# Patient Record
Sex: Female | Born: 1995 | Hispanic: No | Marital: Single | Smoking: Never smoker
Health system: Southern US, Community
[De-identification: ages and names within clinical notes are randomized; demographics above are authoritative.]

## PROBLEM LIST (undated history)

## (undated) ENCOUNTER — Inpatient Hospital Stay (HOSPITAL_COMMUNITY): Payer: Self-pay

## (undated) DIAGNOSIS — Z789 Other specified health status: Secondary | ICD-10-CM

## (undated) HISTORY — PX: NO PAST SURGERIES: SHX2092

---
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
## 2015-04-01 NOTE — L&D Delivery Note (Signed)
Delivery Note At 1:37 PM a viable female was delivered via Vaginal, Spontaneous Delivery (Compound Arm Presentation:OA; ROA ).   APGAR:  8,9 ; weight pending  .   Placenta status: intact, delivered via Tomasa BlaseSchultz .  Cord: intact with the following complications: tight nuchal x1, somersaulted through.  Cord pH: N/A  Anesthesia:  Epidural Episiotomy:  None Lacerations:  First degree right vulvar, Bilateral labial hemostatic Suture Repair: 3.0 vicryl rapide Est. Blood Loss (mL):  200  Mom to postpartum.  Baby to Couplet care / Skin to Skin.  Clayton BiblesSamantha Weinhold, SNM 02/11/2016, 2:02 PM  Midwife attestation: I was gloved and present for delivery in its entirety and I agree with the above student's note.  Donette LarryMelanie Lathan Gieselman, CNM 2:14 PM

## 2015-09-12 ENCOUNTER — Encounter: Payer: Self-pay | Admitting: Certified Nurse Midwife

## 2015-09-12 ENCOUNTER — Ambulatory Visit (INDEPENDENT_AMBULATORY_CARE_PROVIDER_SITE_OTHER): Payer: Medicaid Other | Admitting: Certified Nurse Midwife

## 2015-09-12 VITALS — BP 113/71 | HR 95 | Temp 98.2°F | Wt 191.0 lb

## 2015-09-12 DIAGNOSIS — Z3492 Encounter for supervision of normal pregnancy, unspecified, second trimester: Secondary | ICD-10-CM

## 2015-09-12 DIAGNOSIS — O0932 Supervision of pregnancy with insufficient antenatal care, second trimester: Secondary | ICD-10-CM

## 2015-09-12 DIAGNOSIS — O0930 Supervision of pregnancy with insufficient antenatal care, unspecified trimester: Secondary | ICD-10-CM | POA: Insufficient documentation

## 2015-09-12 DIAGNOSIS — Z3490 Encounter for supervision of normal pregnancy, unspecified, unspecified trimester: Secondary | ICD-10-CM | POA: Insufficient documentation

## 2015-09-12 DIAGNOSIS — B373 Candidiasis of vulva and vagina: Secondary | ICD-10-CM

## 2015-09-12 DIAGNOSIS — B3731 Acute candidiasis of vulva and vagina: Secondary | ICD-10-CM

## 2015-09-12 DIAGNOSIS — Z3482 Encounter for supervision of other normal pregnancy, second trimester: Secondary | ICD-10-CM

## 2015-09-12 LAB — POCT URINALYSIS DIPSTICK
Bilirubin, UA: NEGATIVE
Blood, UA: NEGATIVE
Glucose, UA: 50
NITRITE UA: NEGATIVE
PH UA: 5
Spec Grav, UA: 1.02
Urobilinogen, UA: NEGATIVE

## 2015-09-12 MED ORDER — TERCONAZOLE 0.8 % VA CREA: 1.0000 | TOPICAL_CREAM | Freq: Every day | VAGINAL | Status: DC

## 2015-09-12 MED ORDER — VITAFOL GUMMIES 3.33-0.333-34.8 MG PO CHEW: 3.0000 | CHEWABLE_TABLET | Freq: Every day | ORAL | Status: DC

## 2015-09-12 MED ORDER — FLUCONAZOLE 100 MG PO TABS: 100.0000 mg | ORAL_TABLET | Freq: Once | ORAL | Status: DC

## 2015-09-12 NOTE — Progress Notes (Signed)
Patient has thick, clumpy discharge. Patient has a history of cyst- possible hydradenitis. Patient is having ligament pain.

## 2015-09-12 NOTE — Progress Notes (Signed)
Subjective:    Angel Huynh is being seen today for her first obstetrical visit.  This not prevented, not planned a planned pregnancy. She is at [redacted]w[redacted]d gestation. Her obstetrical history is significant for previous smoker; stopped January 1st. Relationship with FOB: significant other, living together. Patient does intend to breast feed. Pregnancy history fully reviewed.  The information documented in the HPI was reviewed and verified.  Menstrual History: OB History    Gravida Para Term Preterm AB TAB SAB Ectopic Multiple Living   21 mo old, term, 6#9oz  Menarche age: 20 years old  Patient's last menstrual period was 04/27/2015 (approximate).    No past medical history on file.  No past surgical history on file.   (Not in a hospital admission) No Known Allergies  Social History  Substance Use Topics  . Smoking status: Not on file  . Smokeless tobacco: Not on file  . Alcohol Use: Not on file    No family history on file.   Review of Systems Constitutional: negative for weight loss Gastrointestinal: negative for vomiting Genitourinary:negative for genital lesions and vaginal discharge and dysuria Musculoskeletal:negative for back pain Behavioral/Psych: negative for abusive relationship, depression, illegal drug usage and tobacco use    Objective:    BP 113/71 mmHg  Pulse 95  Temp(Src) 98.2 F (36.8 C)  Wt 191 lb (86.637 kg)  LMP 04/27/2015 (Approximate) General Appearance:    Alert, cooperative, no distress, appears stated age  Head:    Normocephalic, without obvious abnormality, atraumatic  Eyes:    PERRL, conjunctiva/corneas clear, EOM's intact, fundi    benign, both eyes  Ears:    Normal TM's and external ear canals, both ears  Nose:   Nares normal, septum midline, mucosa normal, no drainage    or sinus tenderness  Throat:   Lips, mucosa, and tongue normal; teeth and gums normal  Neck:   Supple, symmetrical, trachea midline, no adenopathy;   thyroid:  no enlargement/tenderness/nodules; no carotid   bruit or JVD  Back:     Symmetric, no curvature, ROM normal, no CVA tenderness  Lungs:     Clear to auscultation bilaterally, respirations unlabored  Chest Wall:    No tenderness or deformity   Heart:    Regular rate and rhythm, S1 and S2 normal, no murmur, rub   or gallop  Breast Exam:    No tenderness, masses, or nipple abnormality  Abdomen:     Soft, non-tender, bowel sounds active all four quadrants,    no masses, no organomegaly  Genitalia:    Normal female without lesion, discharge or tenderness  Extremities:   Extremities normal, atraumatic, no cyanosis or edema  Pulses:   2+ and symmetric all extremities  Skin:   Skin color, texture, turgor normal, no rashes or lesions  Lymph nodes:   Cervical, supraclavicular, and axillary nodes normal  Neurologic:   CNII-XII intact, normal strength, sensation and reflexes    throughout      Lab Review Urine pregnancy test Labs reviewed no Radiologic studies reviewed no Assessment:    Pregnancy at [redacted]w[redacted]d weeks   Late to prenatal care  Plan:      Prenatal vitamins.  Counseling provided regarding continued use of seat belts, cessation of alcohol consumption, smoking or use of illicit drugs; infection precautions i.e., influenza/TDAP immunizations, toxoplasmosis,CMV, parvovirus, listeria and varicella; workplace safety, exercise during pregnancy; routine dental care, safe medications, sexual activity, hot  tubs, saunas, pools, travel, caffeine use, fish and methlymercury, potential toxins, hair treatments, varicose veins Weight gain recommendations per IOM guidelines reviewed: underweight/BMI< 18.5--> gain 28 - 40 lbs; normal weight/BMI 18.5 - 24.9--> gain 25 - 35 lbs; overweight/BMI 25 - 29.9--> gain 15 - 25 lbs; obese/BMI >30->gain  11 - 20 lbs Problem list reviewed and updated. FIRST/CF mutation testing/NIPT/QUAD SCREEN/fragile X/Ashkenazi Jewish population testing/Spinal muscular  atrophy discussed: ordered. Role of ultrasound in pregnancy discussed; fetal survey: ordered. Amniocentesis discussed: not indicated. VBAC calculator score: VBAC consent form provided Meds ordered this encounter  Medications  . Prenatal Vit-Fe Fumarate-FA (PRENATAL MULTIVITAMIN) TABS tablet    Sig: Take 1 tablet by mouth daily at 12 noon.   Orders Placed This Encounter  Procedures  . Culture, OB Urine  . HIV antibody  . Hemoglobinopathy evaluation  . Varicella zoster antibody, IgG  . VITAMIN D 25 Hydroxy (Vit-D Deficiency, Fractures)  . Prenatal Profile I  . POCT urinalysis dipstick    Follow up in 4 weeks. 50% of 30 min visit spent on counseling and coordination of care.

## 2015-09-13 ENCOUNTER — Other Ambulatory Visit: Payer: Self-pay | Admitting: Certified Nurse Midwife

## 2015-09-14 LAB — URINE CULTURE, OB REFLEX

## 2015-09-14 LAB — CULTURE, OB URINE

## 2015-09-17 LAB — NUSWAB VG+, CANDIDA 6SP
ATOPOBIUM VAGINAE: HIGH {score} — AB
CANDIDA LUSITANIAE, NAA: NEGATIVE
CANDIDA PARAPSILOSIS, NAA: NEGATIVE
CHLAMYDIA TRACHOMATIS, NAA: NEGATIVE
Candida albicans, NAA: POSITIVE — AB
Candida glabrata, NAA: NEGATIVE
Candida krusei, NAA: NEGATIVE
Candida tropicalis, NAA: NEGATIVE
NEISSERIA GONORRHOEAE, NAA: NEGATIVE
Trich vag by NAA: NEGATIVE

## 2015-09-18 ENCOUNTER — Ambulatory Visit (INDEPENDENT_AMBULATORY_CARE_PROVIDER_SITE_OTHER): Payer: Medicaid Other

## 2015-09-18 DIAGNOSIS — Z3482 Encounter for supervision of other normal pregnancy, second trimester: Secondary | ICD-10-CM

## 2015-09-18 DIAGNOSIS — Z3492 Encounter for supervision of normal pregnancy, unspecified, second trimester: Secondary | ICD-10-CM

## 2015-09-18 DIAGNOSIS — Z36 Encounter for antenatal screening of mother: Secondary | ICD-10-CM

## 2015-09-18 LAB — PRENATAL PROFILE I(LABCORP)
ANTIBODY SCREEN: NEGATIVE
BASOS: 0 %
Basophils Absolute: 0 10*3/uL (ref 0.0–0.2)
EOS (ABSOLUTE): 0.1 10*3/uL (ref 0.0–0.4)
Eos: 2 %
HEMATOCRIT: 35.6 % (ref 34.0–46.6)
HEMOGLOBIN: 12 g/dL (ref 11.1–15.9)
Hepatitis B Surface Ag: NEGATIVE
Immature Grans (Abs): 0 10*3/uL (ref 0.0–0.1)
Immature Granulocytes: 0 %
LYMPHS ABS: 2.5 10*3/uL (ref 0.7–3.1)
Lymphs: 28 %
MCH: 27.3 pg (ref 26.6–33.0)
MCHC: 33.7 g/dL (ref 31.5–35.7)
MCV: 81 fL (ref 79–97)
MONOS ABS: 0.7 10*3/uL (ref 0.1–0.9)
Monocytes: 7 %
Neutrophils Absolute: 5.6 10*3/uL (ref 1.4–7.0)
Neutrophils: 63 %
PLATELETS: 264 10*3/uL (ref 150–379)
RBC: 4.4 x10E6/uL (ref 3.77–5.28)
RDW: 14.9 % (ref 12.3–15.4)
RPR: NONREACTIVE
RUBELLA: 1.47 {index} (ref >0.99)
Rh Factor: POSITIVE
WBC: 8.9 10*3/uL (ref 3.4–10.8)

## 2015-09-18 LAB — MATERNIT21 PLUS CORE+SCA
Chromosome 13: NEGATIVE
Chromosome 18: NEGATIVE
Chromosome 21: NEGATIVE
PDF: 0
Y Chromosome: DETECTED

## 2015-09-18 LAB — HEMOGLOBINOPATHY EVALUATION
HEMOGLOBIN F QUANTITATION: 0 % (ref 0.0–2.0)
HGB C: 0 %
HGB S: 0 %
Hemoglobin A2 Quantitation: 2.4 % (ref 0.7–3.1)
Hgb A: 97.6 % (ref 94.0–98.0)

## 2015-09-18 LAB — VARICELLA ZOSTER ANTIBODY, IGG

## 2015-09-18 LAB — VITAMIN D 25 HYDROXY (VIT D DEFICIENCY, FRACTURES): VIT D 25 HYDROXY: 12.8 ng/mL — AB (ref 30.0–100.0)

## 2015-09-18 LAB — HIV ANTIBODY (ROUTINE TESTING W REFLEX): HIV SCREEN 4TH GENERATION: NONREACTIVE

## 2015-09-19 ENCOUNTER — Other Ambulatory Visit: Payer: Self-pay | Admitting: Certified Nurse Midwife

## 2015-09-20 ENCOUNTER — Encounter: Payer: Self-pay | Admitting: *Deleted

## 2015-10-10 ENCOUNTER — Ambulatory Visit (INDEPENDENT_AMBULATORY_CARE_PROVIDER_SITE_OTHER): Payer: Medicaid Other | Admitting: Certified Nurse Midwife

## 2015-10-10 VITALS — BP 105/70 | HR 86 | Wt 195.0 lb

## 2015-10-10 DIAGNOSIS — Z3482 Encounter for supervision of other normal pregnancy, second trimester: Secondary | ICD-10-CM

## 2015-10-10 LAB — POCT URINALYSIS DIPSTICK
BILIRUBIN UA: NEGATIVE
Blood, UA: NEGATIVE
Glucose, UA: NEGATIVE
KETONES UA: NEGATIVE
LEUKOCYTES UA: NEGATIVE
Nitrite, UA: NEGATIVE
PROTEIN UA: NEGATIVE
Spec Grav, UA: 1.02
Urobilinogen, UA: NEGATIVE
pH, UA: 5

## 2015-10-10 NOTE — Progress Notes (Signed)
Patient reports she is doing well 

## 2015-10-10 NOTE — Progress Notes (Signed)
Subjective:    Angel NortonJazmin Huynh is a 20 y.o. female being seen today for her obstetrical visit. She is at 774w5d gestation. Patient reports: no complaints . Fetal movement: normal.  Problem List Items Addressed This Visit      Other   Encounter for supervision of other normal pregnancy in second trimester - Primary   Relevant Orders   POCT urinalysis dipstick (Completed)     Patient Active Problem List   Diagnosis Date Noted  . Encounter for supervision of other normal pregnancy in second trimester 09/12/2015  . Late prenatal care affecting pregnancy in second trimester, antepartum 09/12/2015   Objective:    BP 105/70 mmHg  Pulse 86  Wt 195 lb (88.451 kg)  LMP 04/27/2015 (Approximate) FHT: 150 BPM  Uterine Size: 25 cm and size greater than dates     Assessment:    Pregnancy @ 124w5d    Doing well  Plan:    OBGCT: discussed and ordered for next visit. Signs and symptoms of preterm labor: discussed.  Labs, problem list reviewed and updated 2 hr GTT planned Follow up in 4 weeks.

## 2015-11-07 ENCOUNTER — Ambulatory Visit (INDEPENDENT_AMBULATORY_CARE_PROVIDER_SITE_OTHER): Payer: Medicaid Other | Admitting: Certified Nurse Midwife

## 2015-11-07 ENCOUNTER — Other Ambulatory Visit: Payer: Medicaid Other

## 2015-11-07 VITALS — BP 123/82 | HR 122 | Temp 98.3°F | Wt 196.2 lb

## 2015-11-07 DIAGNOSIS — Z3482 Encounter for supervision of other normal pregnancy, second trimester: Secondary | ICD-10-CM

## 2015-11-07 DIAGNOSIS — Z3492 Encounter for supervision of normal pregnancy, unspecified, second trimester: Secondary | ICD-10-CM

## 2015-11-07 LAB — POCT URINALYSIS DIPSTICK
BILIRUBIN UA: NEGATIVE
GLUCOSE UA: NEGATIVE
LEUKOCYTES UA: NEGATIVE
NITRITE UA: NEGATIVE
Protein, UA: NEGATIVE
RBC UA: NEGATIVE
Spec Grav, UA: 1.015
UROBILINOGEN UA: 0.2
pH, UA: 6

## 2015-11-07 NOTE — Progress Notes (Signed)
Pt c/o upper abdominal pressure

## 2015-11-07 NOTE — Addendum Note (Signed)
Addended by: Marya LandryFOSTER, Xylah Early D on: 11/07/2015 10:24 AM   Modules accepted: Orders

## 2015-11-07 NOTE — Progress Notes (Signed)
Subjective:    Angel Huynh is a 20 y.o. female being seen today for her obstetrical visit. She is at 7035w5d gestation. Patient reports: backache, heartburn, no bleeding, no contractions and no leaking.  Has a hx of reflux, does not want pills will try OTC Tums.  Fetal movement: normal.  Problem List Items Addressed This Visit    None    Visit Diagnoses    Prenatal care, second trimester    -  Primary   Relevant Orders   POCT Urinalysis Dipstick (Completed)     Patient Active Problem List   Diagnosis Date Noted  . Encounter for supervision of other normal pregnancy in second trimester 09/12/2015  . Late prenatal care affecting pregnancy in second trimester, antepartum 09/12/2015   Objective:    BP 123/82   Pulse (!) 122   Temp 98.3 F (36.8 C)   Wt 196 lb 3.2 oz (89 kg)   LMP 04/27/2015 (Approximate)  FHT: 135 BPM  Uterine Size: 28 cm and size equals dates     Assessment:    Pregnancy @ 4935w5d    2 hr OGTT today  Reflux in pregnancy  Plan:    OBGCT: ordered. Signs and symptoms of preterm labor: discussed.  Labs, problem list reviewed and updated 2 hr GTT planned Follow up in 2 weeks.

## 2015-11-08 LAB — CBC
HEMOGLOBIN: 11.2 g/dL (ref 11.1–15.9)
Hematocrit: 33.6 % — ABNORMAL LOW (ref 34.0–46.6)
MCH: 27.6 pg (ref 26.6–33.0)
MCHC: 33.3 g/dL (ref 31.5–35.7)
MCV: 83 fL (ref 79–97)
PLATELETS: 240 10*3/uL (ref 150–379)
RBC: 4.06 x10E6/uL (ref 3.77–5.28)
RDW: 13.8 % (ref 12.3–15.4)
WBC: 12.2 10*3/uL — AB (ref 3.4–10.8)

## 2015-11-08 LAB — GLUCOSE TOLERANCE, 2 HOURS W/ 1HR
GLUCOSE, 1 HOUR: 115 mg/dL (ref 65–179)
GLUCOSE, FASTING: 70 mg/dL (ref 65–91)
Glucose, 2 hour: 96 mg/dL (ref 65–152)

## 2015-11-08 LAB — HIV ANTIBODY (ROUTINE TESTING W REFLEX): HIV SCREEN 4TH GENERATION: NONREACTIVE

## 2015-11-08 LAB — RPR: RPR: NONREACTIVE

## 2015-11-21 ENCOUNTER — Ambulatory Visit (INDEPENDENT_AMBULATORY_CARE_PROVIDER_SITE_OTHER): Payer: Medicaid Other | Admitting: Certified Nurse Midwife

## 2015-11-21 VITALS — BP 109/69 | HR 100 | Temp 98.9°F | Wt 198.6 lb

## 2015-11-21 DIAGNOSIS — Z3492 Encounter for supervision of normal pregnancy, unspecified, second trimester: Secondary | ICD-10-CM

## 2015-11-21 DIAGNOSIS — Z23 Encounter for immunization: Secondary | ICD-10-CM

## 2015-11-21 DIAGNOSIS — Z3482 Encounter for supervision of other normal pregnancy, second trimester: Secondary | ICD-10-CM

## 2015-11-21 LAB — POCT URINALYSIS DIPSTICK
BILIRUBIN UA: NEGATIVE
GLUCOSE UA: NEGATIVE
Ketones, UA: NEGATIVE
LEUKOCYTES UA: NEGATIVE
NITRITE UA: NEGATIVE
Protein, UA: NEGATIVE
RBC UA: NEGATIVE
Spec Grav, UA: 1.015
UROBILINOGEN UA: 0.2
pH, UA: 7

## 2015-11-21 NOTE — Progress Notes (Signed)
Patient has not started using TUMS yet- she is still having upper pressure.

## 2015-11-21 NOTE — Progress Notes (Signed)
Subjective:    Merita NortonJazmin Stice is a 20 y.o. female being seen today for her obstetrical visit. She is at 5245w5d gestation. Patient reports no complaints. Fetal movement: normal.  Problem List Items Addressed This Visit      Other   Encounter for supervision of other normal pregnancy in second trimester    Other Visit Diagnoses    Prenatal care, second trimester    -  Primary   Relevant Orders   POCT Urinalysis Dipstick (Completed)   Encounter for immunization       Relevant Orders   Flu Vaccine QUAD 36+ mos IM (Completed)     Patient Active Problem List   Diagnosis Date Noted  . Encounter for supervision of other normal pregnancy in second trimester 09/12/2015  . Late prenatal care affecting pregnancy in second trimester, antepartum 09/12/2015   Objective:    BP 109/69   Pulse 100   Temp 98.9 F (37.2 C)   Wt 198 lb 9.6 oz (90.1 kg)   LMP 04/27/2015 (Approximate)  FHT:  152 BPM  Uterine Size: 30 cm and size equals dates  Presentation: cephalic     Assessment:    Pregnancy @ 6145w5d weeks   H/O GERD  Plan:    Influenza today   labs reviewed, problem list updated Consent signed. GBS planning TDAP offered, declined  Rhogam given for RH negative Pediatrician: discussed. Infant feeding: plans to breastfeed. Maternity leave: N/A. Cigarette smoking: never smoked. Orders Placed This Encounter  Procedures  . Flu Vaccine QUAD 36+ mos IM  . POCT Urinalysis Dipstick   No orders of the defined types were placed in this encounter.  Follow up in 2 Weeks.

## 2015-12-07 ENCOUNTER — Ambulatory Visit (INDEPENDENT_AMBULATORY_CARE_PROVIDER_SITE_OTHER): Payer: Medicaid Other | Admitting: Advanced Practice Midwife

## 2015-12-07 VITALS — BP 98/69 | HR 112 | Temp 98.5°F | Wt 194.6 lb

## 2015-12-07 DIAGNOSIS — Z3482 Encounter for supervision of other normal pregnancy, second trimester: Secondary | ICD-10-CM

## 2015-12-07 DIAGNOSIS — Z23 Encounter for immunization: Secondary | ICD-10-CM | POA: Diagnosis not present

## 2015-12-07 DIAGNOSIS — O0932 Supervision of pregnancy with insufficient antenatal care, second trimester: Secondary | ICD-10-CM

## 2015-12-07 NOTE — Patient Instructions (Signed)
Tdap Vaccine (Tetanus, Diphtheria and Pertussis): What You Need to Know 1. Why get vaccinated? Tetanus, diphtheria and pertussis are very serious diseases. Tdap vaccine can protect us from these diseases. And, Tdap vaccine given to pregnant women can protect newborn babies against pertussis. TETANUS (Lockjaw) is rare in the [place] today. It causes painful muscle tightening and stiffness, usually all over the body.  It can lead to tightening of muscles in the head and neck so you can't open your mouth, swallow, or sometimes even breathe. Tetanus kills about 1 out of 10 people who are infected even after receiving the best medical care. DIPHTHERIA is also rare in the [place] today. It can cause a thick coating to form in the back of the throat.  It can lead to breathing problems, heart failure, paralysis, and death. PERTUSSIS (Whooping Cough) causes severe coughing spells, which can cause difficulty breathing, vomiting and disturbed sleep.  It can also lead to weight loss, incontinence, and rib fractures. Up to 2 in 100 adolescents and 5 in 100 adults with pertussis are hospitalized or have complications, which could include pneumonia or death. These diseases are caused by bacteria. Diphtheria and pertussis are spread from person to person through secretions from coughing or sneezing. Tetanus enters the body through cuts, scratches, or wounds. Before vaccines, as many as 200,000 cases of diphtheria, 200,000 cases of pertussis, and hundreds of cases of tetanus, were reported in the [place] each year. Since vaccination began, reports of cases for tetanus and diphtheria have dropped by about 99% and for pertussis by about 80%. 2. Tdap vaccine Tdap vaccine can protect adolescents and adults from tetanus, diphtheria, and pertussis. One dose of Tdap is routinely given at age 11 or 12. People who did not get Tdap at that age should get it as soon as possible. Tdap is especially important  for healthcare professionals and anyone having close contact with a baby younger than 12 months. Pregnant women should get a dose of Tdap during every pregnancy, to protect the newborn from pertussis. Infants are most at risk for severe, life-threatening complications from pertussis. Another vaccine, called Td, protects against tetanus and diphtheria, but not pertussis. A Td booster should be given every 10 years. Tdap may be given as one of these boosters if you have never gotten Tdap before. Tdap may also be given after a severe cut or burn to prevent tetanus infection. Your doctor or the person giving you the vaccine can give you more information. Tdap may safely be given at the same time as other vaccines. 3. Some people should not get this vaccine  A person who has ever had a life-threatening allergic reaction after a previous dose of any diphtheria, tetanus or pertussis containing vaccine, OR has a severe allergy to any part of this vaccine, should not get Tdap vaccine. Tell the person giving the vaccine about any severe allergies.  Anyone who had coma or long repeated seizures within 7 days after a childhood dose of DTP or DTaP, or a previous dose of Tdap, should not get Tdap, unless a cause other than the vaccine was found. They can still get Td.  Talk to your doctor if you:  have seizures or another nervous system problem,  had severe pain or swelling after any vaccine containing diphtheria, tetanus or pertussis,  ever had a condition called Guillain-Barr Syndrome (GBS),  aren't feeling well on the day the shot is scheduled. 4. Risks With any medicine, including vaccines, there is   a chance of side effects. These are usually mild and go away on their own. Serious reactions are also possible but are rare. Most people who get Tdap vaccine do not have any problems with it. Mild problems following Tdap (Did not interfere with activities)  Pain where the shot was given (about 3 in 4  adolescents or 2 in 3 adults)  Redness or swelling where the shot was given (about 1 person in 5)  Mild fever of at least 100.4F (up to about 1 in 25 adolescents or 1 in 100 adults)  Headache (about 3 or 4 people in 10)  Tiredness (about 1 person in 3 or 4)  Nausea, vomiting, diarrhea, stomach ache (up to 1 in 4 adolescents or 1 in 10 adults)  Chills, sore joints (about 1 person in 10)  Body aches (about 1 person in 3 or 4)  Rash, swollen glands (uncommon) Moderate problems following Tdap (Interfered with activities, but did not require medical attention)  Pain where the shot was given (up to 1 in 5 or 6)  Redness or swelling where the shot was given (up to about 1 in 16 adolescents or 1 in 12 adults)  Fever over 102F (about 1 in 100 adolescents or 1 in 250 adults)  Headache (about 1 in 7 adolescents or 1 in 10 adults)  Nausea, vomiting, diarrhea, stomach ache (up to 1 or 3 people in 100)  Swelling of the entire arm where the shot was given (up to about 1 in 500). Severe problems following Tdap (Unable to perform usual activities; required medical attention)  Swelling, severe pain, bleeding and redness in the arm where the shot was given (rare). Problems that could happen after any vaccine:  People sometimes faint after a medical procedure, including vaccination. Sitting or lying down for about 15 minutes can help prevent fainting, and injuries caused by a fall. Tell your doctor if you feel dizzy, or have vision changes or ringing in the ears.  Some people get severe pain in the shoulder and have difficulty moving the arm where a shot was given. This happens very rarely.  Any medication can cause a severe allergic reaction. Such reactions from a vaccine are very rare, estimated at fewer than 1 in a million doses, and would happen within a few minutes to a few hours after the vaccination. As with any medicine, there is a very remote chance of a vaccine causing a serious  injury or death. The safety of vaccines is always being monitored. For more information, visit: www.cdc.gov/vaccinesafety/ 5. What if there is a serious problem? What should I look for?  Look for anything that concerns you, such as signs of a severe allergic reaction, very high fever, or unusual behavior.  Signs of a severe allergic reaction can include hives, swelling of the face and throat, difficulty breathing, a fast heartbeat, dizziness, and weakness. These would usually start a few minutes to a few hours after the vaccination. What should I do?  If you think it is a severe allergic reaction or other emergency that can't wait, call 9-1-1 or get the person to the nearest hospital. Otherwise, call your doctor.  Afterward, the reaction should be reported to the Vaccine Adverse Event Reporting System (VAERS). Your doctor might file this report, or you can do it yourself through the VAERS web site at www.vaers.hhs.gov, or by calling 1-800-822-7967. VAERS does not give medical advice.  6. The National Vaccine Injury Compensation Program The National Vaccine Injury Compensation Program (  VICP) is a federal program that was created to compensate people who may have been injured by certain vaccines. Persons who believe they may have been injured by a vaccine can learn about the program and about filing a claim by calling 1-800-338-2382 or visiting the VICP website at www.hrsa.gov/vaccinecompensation. There is a time limit to file a claim for compensation. 7. How can I learn more?  Ask your doctor. He or she can give you the vaccine package insert or suggest other sources of information.  Call your local or state health department.  Contact the Centers for Disease Control and Prevention (CDC):  Call 1-800-232-4636 (1-800-CDC-INFO) or  Visit CDC's website at www.cdc.gov/vaccines CDC Tdap Vaccine VIS (05/24/13)   This information is not intended to replace advice given to you by your health care  provider. Make sure you discuss any questions you have with your health care provider.   Document Released: 09/16/2011 Document Revised: 04/07/2014 Document Reviewed: 06/29/2013 Elsevier Interactive Patient Education 2016 Elsevier Inc.  

## 2015-12-07 NOTE — Addendum Note (Signed)
Addended by: Francene FindersJAMES, Wadell Craddock C on: 12/07/2015 10:55 AM   Modules accepted: Orders

## 2015-12-07 NOTE — Progress Notes (Signed)
   PRENATAL VISIT NOTE  Subjective:  Angel Huynh is a 20 y.o. G2P1001 at 362w0d being seen today for ongoing prenatal care.  She is currently monitored for the following issues for this low-risk pregnancy and has Encounter for supervision of other normal pregnancy in second trimester and Late prenatal care affecting pregnancy in second trimester, antepartum on her problem list.  Patient reports no complaints.  Contractions: Irregular. Vag. Bleeding: None.  Movement: Present. Denies leaking of fluid.   The following portions of the patient's history were reviewed and updated as appropriate: allergies, current medications, past family history, past medical history, past social history, past surgical history and problem list. Problem list updated.  Objective:   Vitals:   12/07/15 0939  BP: 98/69  Pulse: (!) 112  Temp: 98.5 F (36.9 C)  Weight: 194 lb 9.6 oz (88.3 kg)    Fetal Status: Fetal Heart Rate (bpm): 136 Fundal Height: 33 cm Movement: Present  Presentation: Vertex  General:  Alert, oriented and cooperative. Patient is in no acute distress.  Skin: Skin is warm and dry. No rash noted.   Cardiovascular: Normal heart rate noted  Respiratory: Normal respiratory effort, no problems with respiration noted  Abdomen: Soft, gravid, appropriate for gestational age. Pain/Pressure: Absent     Pelvic:  Cervical exam deferred        Extremities: Normal range of motion.  Edema: None  Mental Status: Normal mood and affect. Normal behavior. Normal judgment and thought content.   Urinalysis:      Assessment and Plan:  Pregnancy: G2P1001 at 7862w0d  1. Encounter for supervision of other normal pregnancy in second trimester   2. Late prenatal care affecting pregnancy in second trimester, antepartum   3. Need for diphtheria-tetanus-pertussis (Tdap) vaccine - TDaP  Preterm labor symptoms and general obstetric precautions including but not limited to vaginal bleeding, contractions, leaking  of fluid and fetal movement were reviewed in detail with the patient. Please refer to After Visit Summary for other counseling recommendations.  F/U 2 weeks  Dorathy KinsmanVirginia Dajuan Turnley, [place]

## 2015-12-25 ENCOUNTER — Encounter: Payer: Self-pay | Admitting: *Deleted

## 2015-12-25 ENCOUNTER — Ambulatory Visit (INDEPENDENT_AMBULATORY_CARE_PROVIDER_SITE_OTHER): Payer: Medicaid Other | Admitting: Obstetrics & Gynecology

## 2015-12-25 VITALS — BP 103/69 | HR 120 | Temp 98.3°F | Wt 198.4 lb

## 2015-12-25 DIAGNOSIS — Z3483 Encounter for supervision of other normal pregnancy, third trimester: Secondary | ICD-10-CM

## 2015-12-25 LAB — POCT URINALYSIS DIPSTICK
BILIRUBIN UA: NEGATIVE
Glucose, UA: NEGATIVE
KETONES UA: NEGATIVE
Nitrite, UA: NEGATIVE
PH UA: 5
RBC UA: NEGATIVE
SPEC GRAV UA: 1.025
Urobilinogen, UA: NEGATIVE

## 2015-12-25 MED ORDER — FLUCONAZOLE 150 MG PO TABS: 150.0000 mg | ORAL_TABLET | Freq: Once | ORAL | 0 refills | Status: AC

## 2015-12-25 NOTE — Progress Notes (Signed)
   PRENATAL VISIT NOTE  Subjective:  Angel Huynh is a 20 y.o. G2P1001 at 7358w1d being seen today for ongoing prenatal care.  She is currently monitored for the following issues for this low-risk pregnancy and has Encounter for supervision of other normal pregnancy in second trimester and Late prenatal care affecting pregnancy, antepartum on her problem list.  Patient reports no complaints.  Contractions: Irregular. Vag. Bleeding: None.  Movement: Present. Denies leaking of fluid.   The following portions of the patient's history were reviewed and updated as appropriate: allergies, current medications, past family history, past medical history, past social history, past surgical history and problem list. Problem list updated.  Objective:   Vitals:   12/25/15 0902  BP: 103/69  Pulse: (!) 120  Temp: 98.3 F (36.8 C)  Weight: 198 lb 6.4 oz (90 kg)    Fetal Status: Fetal Heart Rate (bpm): 135 Fundal Height: 35 cm Movement: Present     General:  Alert, oriented and cooperative. Patient is in no acute distress.  Skin: Skin is warm and dry. No rash noted.   Cardiovascular: Normal heart rate noted  Respiratory: Normal respiratory effort, no problems with respiration noted  Abdomen: Soft, gravid, appropriate for gestational age. Pain/Pressure: Absent     Pelvic:  Cervical exam deferred        Extremities: Normal range of motion.  Edema: None  Mental Status: Normal mood and affect. Normal behavior. Normal judgment and thought content.   Urinalysis:      Assessment and Plan:  Pregnancy: G2P1001 at 5758w1d  1. Supervision of normal pregnancy, antepartum, third trimester Sx of yeast - POCT Urinalysis Dipstick - fluconazole (DIFLUCAN) 150 MG tablet; Take 1 tablet (150 mg total) by mouth once.  Dispense: 1 tablet; Refill: 0  Preterm labor symptoms and general obstetric precautions including but not limited to vaginal bleeding, contractions, leaking of fluid and fetal movement were reviewed  in detail with the patient. Please refer to After Visit Summary for other counseling recommendations.  2 week f/u Adam PhenixJames G Arnold, MD

## 2015-12-25 NOTE — Progress Notes (Signed)
Patient states that she has been having irregular contractions, but no pressure or bleeding. Patient also states that she thinks she has a yeast infection because she is having itching, and a yellow-tinged discharge.

## 2016-01-08 ENCOUNTER — Ambulatory Visit (INDEPENDENT_AMBULATORY_CARE_PROVIDER_SITE_OTHER): Payer: Medicaid Other | Admitting: Obstetrics & Gynecology

## 2016-01-08 VITALS — BP 106/75 | HR 81 | Temp 99.0°F | Wt 202.4 lb

## 2016-01-08 DIAGNOSIS — Z3483 Encounter for supervision of other normal pregnancy, third trimester: Secondary | ICD-10-CM | POA: Diagnosis not present

## 2016-01-08 DIAGNOSIS — Z3480 Encounter for supervision of other normal pregnancy, unspecified trimester: Secondary | ICD-10-CM

## 2016-01-08 LAB — OB RESULTS CONSOLE GBS: GBS: POSITIVE

## 2016-01-08 NOTE — Progress Notes (Signed)
   PRENATAL VISIT NOTE  Subjective:  Angel Huynh is a 20 y.o. G2P1001 at 3358w1d being seen today for ongoing prenatal care.  She is currently monitored for the following issues for this low-risk pregnancy and has Supervision of normal pregnancy, antepartum and Late prenatal care affecting pregnancy, antepartum on her problem list.  Patient reports no complaints.  Contractions: Irregular. Vag. Bleeding: None.  Movement: Present. Denies leaking of fluid.   The following portions of the patient's history were reviewed and updated as appropriate: allergies, current medications, past family history, past medical history, past social history, past surgical history and problem list. Problem list updated.  Objective:   Vitals:   01/08/16 1336  BP: 106/75  Pulse: 81  Temp: 99 F (37.2 C)  Weight: 202 lb 6.4 oz (91.8 kg)    Fetal Status:     Movement: Present     General:  Alert, oriented and cooperative. Patient is in no acute distress.  Skin: Skin is warm and dry. No rash noted.   Cardiovascular: Normal heart rate noted  Respiratory: Normal respiratory effort, no problems with respiration noted  Abdomen: Soft, gravid, appropriate for gestational age. Pain/Pressure: Absent     Pelvic:  Cervical exam performed        Extremities: Normal range of motion.  Edema: None  Mental Status: Normal mood and affect. Normal behavior. Normal judgment and thought content.   Urinalysis:      Assessment and Plan:  Pregnancy: G2P1001 at 4958w1d  1. Supervision of other normal pregnancy, antepartum  - Strep Gp B NAA  Preterm labor symptoms and general obstetric precautions including but not limited to vaginal bleeding, contractions, leaking of fluid and fetal movement were reviewed in detail with the patient. Please refer to After Visit Summary for other counseling recommendations.  Return in about 1 week (around 01/15/2016).  Adam PhenixJames G Arnold, MD

## 2016-01-08 NOTE — Progress Notes (Signed)
Patient is in office and states that she overall feels good, and reports good fetal movement.

## 2016-01-09 ENCOUNTER — Encounter: Payer: Medicaid Other | Admitting: Obstetrics

## 2016-01-10 LAB — STREP GP B NAA: STREP GROUP B AG: POSITIVE — AB

## 2016-01-17 ENCOUNTER — Ambulatory Visit (INDEPENDENT_AMBULATORY_CARE_PROVIDER_SITE_OTHER): Payer: Medicaid Other | Admitting: Obstetrics

## 2016-01-17 ENCOUNTER — Encounter: Payer: Self-pay | Admitting: Obstetrics

## 2016-01-17 VITALS — BP 112/73 | HR 100 | Temp 98.7°F | Wt 203.9 lb

## 2016-01-17 DIAGNOSIS — Z3403 Encounter for supervision of normal first pregnancy, third trimester: Secondary | ICD-10-CM | POA: Diagnosis not present

## 2016-01-17 NOTE — Progress Notes (Signed)
Subjective:    Angel Huynh is a 20 y.o. female being seen today for her obstetrical visit. She is at 666w3d gestation. Patient reports no complaints. Fetal movement: normal.  Problem List Items Addressed This Visit    None    Visit Diagnoses   None.    Patient Active Problem List   Diagnosis Date Noted  . Supervision of normal pregnancy, antepartum 09/12/2015  . Late prenatal care affecting pregnancy, antepartum 09/12/2015    Objective:    BP 112/73   Pulse 100   Temp 98.7 F (37.1 C)   Wt 203 lb 14.4 oz (92.5 kg)   LMP 04/27/2015  FHT: 140 BPM  Uterine Size: size equals dates  Presentations: unsure    Assessment:    Pregnancy @ 246w3d weeks   Plan:   Plans for delivery: Vaginal anticipated; labs reviewed; problem list updated Counseling: Consent signed. Infant feeding: plans to breastfeed. Cigarette smoking: unknown. L&D discussion: symptoms of labor, discussed when to call, discussed what number to call, anesthetic/analgesic options reviewed and delivering clinician:  plans no preference. Postpartum supports and preparation: circumcision discussed and contraception plans discussed.  Follow up in 1 Week.

## 2016-01-24 ENCOUNTER — Ambulatory Visit (INDEPENDENT_AMBULATORY_CARE_PROVIDER_SITE_OTHER): Payer: Medicaid Other | Admitting: Obstetrics

## 2016-01-24 ENCOUNTER — Encounter: Payer: Self-pay | Admitting: Obstetrics

## 2016-01-24 VITALS — BP 117/73 | HR 107 | Ht 63.0 in | Wt 204.0 lb

## 2016-01-24 DIAGNOSIS — Z3483 Encounter for supervision of other normal pregnancy, third trimester: Secondary | ICD-10-CM | POA: Diagnosis not present

## 2016-01-24 DIAGNOSIS — O99820 Streptococcus B carrier state complicating pregnancy: Secondary | ICD-10-CM

## 2016-01-24 NOTE — Progress Notes (Signed)
Pt desires Cervical check today for increased vaginal pain/pressure and irregular contractions.

## 2016-01-24 NOTE — Progress Notes (Signed)
Subjective:    Angel Huynh is a 20 y.o. female being seen today for her obstetrical visit. She is at 8060w3d gestation. Patient reports occasional contractions. Fetal movement: normal.  Problem List Items Addressed This Visit    GBS (group B Streptococcus carrier), +RV culture, currently pregnant - Primary    Other Visit Diagnoses   None.    Patient Active Problem List   Diagnosis Date Noted  . GBS (group B Streptococcus carrier), +RV culture, currently pregnant 01/24/2016  . Supervision of normal pregnancy, antepartum 09/12/2015  . Late prenatal care affecting pregnancy, antepartum 09/12/2015    Objective:    BP 117/73   Pulse (!) 107   Ht 5\' 3"  (1.6 m)   Wt 204 lb (92.5 kg)   LMP 04/27/2015   BMI 36.14 kg/m  FHT: 125 BPM  Uterine Size: size equals dates  Presentations: cephalic  Pelvic Exam:              Dilation: 1cm       Effacement: 50%             Station:  -3    Consistency: soft            Position: posterior     Assessment:    Pregnancy @ 2960w3d weeks   Plan:   Plans for delivery: Vaginal anticipated; labs reviewed; problem list updated Counseling: Consent signed. Infant feeding: plans to breastfeed. Cigarette smoking: never smoked L&D discussion: symptoms of labor, discussed when to call, discussed what number to call, anesthetic/analgesic options reviewed and delivering clinician:  plans no preference. Postpartum supports and preparation: circumcision discussed and contraception plans discussed.  Follow up in 1 Week.  Patient ID: Angel Huynh, female   DOB: 10/13/1995, 20 y.o.   MRN: [id number]

## 2016-01-28 ENCOUNTER — Encounter (HOSPITAL_COMMUNITY): Payer: Self-pay

## 2016-01-28 ENCOUNTER — Inpatient Hospital Stay (HOSPITAL_COMMUNITY)
Admission: AD | Admit: 2016-01-28 | Discharge: 2016-01-28 | Disposition: A | Discharge status: Home / Self Care | Payer: Medicaid Other | Source: Ambulatory Visit | Attending: Family Medicine | Admitting: Family Medicine

## 2016-01-28 DIAGNOSIS — O99820 Streptococcus B carrier state complicating pregnancy: Secondary | ICD-10-CM

## 2016-01-28 DIAGNOSIS — Z3490 Encounter for supervision of normal pregnancy, unspecified, unspecified trimester: Secondary | ICD-10-CM | POA: Insufficient documentation

## 2016-01-28 HISTORY — DX: Other specified health status: Z78.9

## 2016-01-28 NOTE — MAU Note (Signed)
Cramping and pains in back. Started yesterday at 1700. No bleeding or leaking. Was 2/50 last wk when checked.

## 2016-01-31 ENCOUNTER — Inpatient Hospital Stay (HOSPITAL_COMMUNITY)
Admission: AD | Admit: 2016-01-31 | Discharge: 2016-01-31 | Disposition: A | Discharge status: Home / Self Care | Payer: Medicaid Other | Source: Ambulatory Visit | Attending: Family Medicine | Admitting: Family Medicine

## 2016-01-31 ENCOUNTER — Encounter (HOSPITAL_COMMUNITY): Payer: Self-pay | Admitting: *Deleted

## 2016-01-31 ENCOUNTER — Ambulatory Visit (INDEPENDENT_AMBULATORY_CARE_PROVIDER_SITE_OTHER): Payer: Medicaid Other | Admitting: Obstetrics and Gynecology

## 2016-01-31 VITALS — BP 116/72 | HR 112 | Wt 202.0 lb

## 2016-01-31 DIAGNOSIS — O471 False labor at or after 37 completed weeks of gestation: Secondary | ICD-10-CM | POA: Insufficient documentation

## 2016-01-31 DIAGNOSIS — O99820 Streptococcus B carrier state complicating pregnancy: Secondary | ICD-10-CM

## 2016-01-31 DIAGNOSIS — Z3A39 39 weeks gestation of pregnancy: Secondary | ICD-10-CM | POA: Diagnosis not present

## 2016-01-31 DIAGNOSIS — O0933 Supervision of pregnancy with insufficient antenatal care, third trimester: Secondary | ICD-10-CM | POA: Diagnosis not present

## 2016-01-31 DIAGNOSIS — Z3480 Encounter for supervision of other normal pregnancy, unspecified trimester: Secondary | ICD-10-CM

## 2016-01-31 DIAGNOSIS — O0930 Supervision of pregnancy with insufficient antenatal care, unspecified trimester: Secondary | ICD-10-CM

## 2016-01-31 LAB — POCT FERN TEST: POCT FERN TEST: NEGATIVE

## 2016-01-31 NOTE — MAU Note (Signed)
Pt reports ? Leaking fluid since 1530 today , also reports contractions.

## 2016-01-31 NOTE — Addendum Note (Signed)
Addended by: Catalina AntiguaONSTANT, Harles Evetts on: 01/31/2016 10:46 AM   Modules accepted: Orders

## 2016-01-31 NOTE — Progress Notes (Signed)
[facility]. Pt states cervical exam was done, she was 3cm.

## 2016-01-31 NOTE — MAU Provider Note (Signed)
Obstetric Attending MAU Note  Chief Complaint:  No chief complaint on file.   None    HPI: Angel Huynh is a 20 y.o. G2P1001 at 6216w3d who presents to maternity admissions reporting leakage of fluid x several hours. Noted panties are wet. No gush and no running of water. Lost her mucous plug. No contractions or vaginal bleeding. Good fetal movement.   Pregnancy Course: [facility]  Patient Active Problem List   Diagnosis Date Noted  . GBS (group B Streptococcus carrier), +RV culture, currently pregnant 01/24/2016  . Supervision of normal pregnancy, antepartum 09/12/2015  . Late prenatal care affecting pregnancy, antepartum 09/12/2015    Past Medical History:  Diagnosis Date  . Medical history non-contributory     OB History  Gravida Para Term Preterm AB Living  2 1 1     1   SAB TAB Ectopic Multiple Live Births          1    # Outcome Date GA Lbr Len/2nd Weight Sex Delivery Anes PTL Lv  2 Current           1 Term 05/31/14 7117w0d  6 lb 9 oz (2.977 kg) F Vag-Spont EPI N LIV      Past Surgical History:  Procedure Laterality Date  . NO PAST SURGERIES      Family History: Family History  Problem Relation Age of Onset  . Diabetes Father     Social History: Social History  Substance Use Topics  . Smoking status: Never Smoker  . Smokeless tobacco: Never Used  . Alcohol use No    Allergies: No Known Allergies  Prescriptions Prior to Admission  Medication Sig Dispense Refill Last Dose  . calcium carbonate (TUMS - DOSED IN MG ELEMENTAL CALCIUM) 500 MG chewable tablet Chew 1 tablet by mouth as needed for indigestion or heartburn.   01/28/2016 at Unknown time  . Prenatal Vit-Fe Phos-FA-Omega (VITAFOL GUMMIES) 3.33-0.333-34.8 MG CHEW Chew 3 each by mouth daily.   Past Week at Unknown time    ROS: Pertinent findings in history of present illness.  Physical Exam  Blood pressure 123/74, pulse 95, temperature 98.5 F (36.9 C), temperature source Oral, resp.  rate 18, height 5\' 3"  (1.6 m), weight 204 lb (92.5 kg), last menstrual period 04/27/2015, SpO2 99 %. CONSTITUTIONAL: Well-developed, well-nourished female in no acute distress.  HENT:  Normocephalic, atraumatic. Oropharynx is clear and moist EYES: Conjunctivae and EOM are normal. No scleral icterus.  NECK: Normal range of motion, supple, no masses SKIN: Skin is warm and dry. No rash noted.  No pallor. NEUROLGIC: Alert and oriented to person, place, and time. PSYCHIATRIC: Normal mood and affect. Normal behavior. Normal judgment and thought content. CARDIOVASCULAR: Normal heart rate noted, regular rhythm RESPIRATORY: Effort normal, no problems with respiration noted ABDOMEN: Soft, nontender, gravid appropriate for gestational age MUSCULOSKELETAL: Normal range of motion. No edema and no tenderness. 2+ distal pulses.  SPECULUM EXAM: NEFG, physiologic discharge, no blood, cervix clean, neg pool, neg fern Dilation: 3 Effacement (%): 60 Cervical Position: Posterior Station: -3, -2 Presentation: Vertex Exam by:: K.Wilson,RN  FHT:  Baseline 140 , moderate variability, accelerations present, no decelerations Contractions: None    Assessment: 1. False labor after 37 weeks of gestation without delivery   2. GBS (group B Streptococcus carrier), +RV culture, currently pregnant     Plan: Discharge home Labor precautions and fetal kick counts reviewed Follow up with OB provider  Follow-up Information    [facility]  [place] Follow up in 1 week(s).   Specialty:  Obstetrics and Gynecology Why:  keep next scheduled appointment Contact information: [address], [address] [place] [id number] [phone]            Medication List    TAKE these medications   calcium carbonate 500 MG chewable tablet Commonly known as:  TUMS - dosed in mg elemental calcium Chew 1 tablet by mouth as needed for indigestion or heartburn.   VITAFOL GUMMIES  3.33-0.333-34.8 MG Chew Chew 3 each by mouth daily.       Reva Boresanya S Marquisa Salih, MD 01/31/2016 8:26 PM

## 2016-01-31 NOTE — Discharge Instructions (Signed)
Braxton Hicks Contractions °Contractions of the uterus can occur throughout pregnancy. Contractions are not always a sign that you are in labor.  °WHAT ARE BRAXTON HICKS CONTRACTIONS?  °Contractions that occur before labor are called Braxton Hicks contractions, or false labor. Toward the end of pregnancy (32-34 weeks), these contractions can develop more often and may become more forceful. This is not true labor because these contractions do not result in opening (dilatation) and thinning of the cervix. They are sometimes difficult to tell apart from true labor because these contractions can be forceful and people have different pain tolerances. You should not feel embarrassed if you go to the hospital with false labor. Sometimes, the only way to tell if you are in true labor is for your health care provider to look for changes in the cervix. °If there are no prenatal problems or other health problems associated with the pregnancy, it is completely safe to be sent home with false labor and await the onset of true labor. °HOW CAN YOU TELL THE DIFFERENCE BETWEEN TRUE AND FALSE LABOR? °False Labor °· The contractions of false labor are usually shorter and not as hard as those of true labor.   °· The contractions are usually irregular.   °· The contractions are often felt in the front of the lower abdomen and in the groin.   °· The contractions may go away when you walk around or change positions while lying down.   °· The contractions get weaker and are shorter lasting as time goes on.   °· The contractions do not usually become progressively stronger, regular, and closer together as with true labor.   °True Labor °· Contractions in true labor last 30-70 seconds, become very regular, usually become more intense, and increase in frequency.   °· The contractions do not go away with walking.   °· The discomfort is usually felt in the top of the uterus and spreads to the lower abdomen and low back.   °· True labor can be  determined by your health care provider with an exam. This will show that the cervix is dilating and getting thinner.   °WHAT TO REMEMBER °· Keep up with your usual exercises and follow other instructions given by your health care provider.   °· Take medicines as directed by your health care provider.   °· Keep your regular prenatal appointments.   °· Eat and drink lightly if you think you are going into labor.   °· If Braxton Hicks contractions are making you uncomfortable:   °¨ Change your position from lying down or resting to walking, or from walking to resting.   °¨ Sit and rest in a tub of warm water.   °¨ Drink 2-3 glasses of water. Dehydration may cause these contractions.   °¨ Do slow and deep breathing several times an hour.   °WHEN SHOULD I SEEK IMMEDIATE MEDICAL CARE? °Seek immediate medical care if: °· Your contractions become stronger, more regular, and closer together.   °· You have fluid leaking or gushing from your vagina.   °· You have a fever.   °· You pass blood-tinged mucus.   °· You have vaginal bleeding.   °· You have continuous abdominal pain.   °· You have low back pain that you never had before.   °· You feel your baby's head pushing down and causing pelvic pressure.   °· Your baby is not moving as much as it used to.   °  °This information is not intended to replace advice given to you by your health care provider. Make sure you discuss any questions you have with your health care   provider. °  °Document Released: 03/17/2005 Document Revised: 03/22/2013 Document Reviewed: 12/27/2012 °Elsevier Interactive Patient Education ©2016 [employer] ° °

## 2016-01-31 NOTE — Progress Notes (Signed)
   PRENATAL VISIT NOTE  Subjective:  Angel Huynh is a 20 y.o. G2P1001 at 130w3d being seen today for ongoing prenatal care.  She is currently monitored for the following issues for this low-risk pregnancy and has Supervision of normal pregnancy, antepartum; Late prenatal care affecting pregnancy, antepartum; and GBS (group B Streptococcus carrier), +RV culture, currently pregnant on her problem list.  Patient reports no complaints.  Contractions: Irregular. Vag. Bleeding: None.  Movement: Present. Denies leaking of fluid.   The following portions of the patient's history were reviewed and updated as appropriate: allergies, current medications, past family history, past medical history, past social history, past surgical history and problem list. Problem list updated.  Objective:   Vitals:   01/31/16 1018  BP: 116/72  Pulse: (!) 112  Weight: 202 lb (91.6 kg)    Fetal Status: Fetal Heart Rate (bpm): 129 Fundal Height: 39 cm Movement: Present     General:  Alert, oriented and cooperative. Patient is in no acute distress.  Skin: Skin is warm and dry. No rash noted.   Cardiovascular: Normal heart rate noted  Respiratory: Normal respiratory effort, no problems with respiration noted  Abdomen: Soft, gravid, appropriate for gestational age. Pain/Pressure: Present     Pelvic:  Cervical exam deferred        Extremities: Normal range of motion.  Edema: None  Mental Status: Normal mood and affect. Normal behavior. Normal judgment and thought content.   Assessment and Plan:  Pregnancy: G2P1001 at [redacted]w[redacted]d  1. GBS (group B Streptococcus carrier), +RV culture, currently pregnant Will treat in labor   2. Supervision of other normal pregnancy, antepartum Patient is doing well without complaint Post date testing at next visit with plans for IOL at 41 weeks if no labor  3. Late prenatal care affecting pregnancy, antepartum   Term labor symptoms and general obstetric precautions including but  not limited to vaginal bleeding, contractions, leaking of fluid and fetal movement were reviewed in detail with the patient. Please refer to After Visit Summary for other counseling recommendations.  Return in about 1 week (around 02/07/2016) for ROB with NST and AFI.  Catalina AntiguaPeggy Raquel Racey, MD

## 2016-02-06 ENCOUNTER — Ambulatory Visit (HOSPITAL_COMMUNITY)
Admission: RE | Admit: 2016-02-06 | Discharge: 2016-02-06 | Disposition: A | Discharge status: Home / Self Care | Payer: Medicaid Other | Source: Ambulatory Visit | Attending: Obstetrics and Gynecology | Admitting: Obstetrics and Gynecology

## 2016-02-06 ENCOUNTER — Encounter (HOSPITAL_COMMUNITY): Payer: Self-pay

## 2016-02-06 ENCOUNTER — Other Ambulatory Visit: Payer: Self-pay | Admitting: Obstetrics and Gynecology

## 2016-02-06 DIAGNOSIS — O480 Post-term pregnancy: Secondary | ICD-10-CM | POA: Diagnosis not present

## 2016-02-06 DIAGNOSIS — O0933 Supervision of pregnancy with insufficient antenatal care, third trimester: Secondary | ICD-10-CM | POA: Insufficient documentation

## 2016-02-06 DIAGNOSIS — Z3A40 40 weeks gestation of pregnancy: Secondary | ICD-10-CM | POA: Insufficient documentation

## 2016-02-06 DIAGNOSIS — Z3480 Encounter for supervision of other normal pregnancy, unspecified trimester: Secondary | ICD-10-CM

## 2016-02-07 ENCOUNTER — Other Ambulatory Visit: Payer: Self-pay | Admitting: Certified Nurse Midwife

## 2016-02-07 ENCOUNTER — Ambulatory Visit (INDEPENDENT_AMBULATORY_CARE_PROVIDER_SITE_OTHER): Payer: Medicaid Other | Admitting: Certified Nurse Midwife

## 2016-02-07 VITALS — BP 107/70 | HR 103 | Wt 211.0 lb

## 2016-02-07 DIAGNOSIS — Z3689 Encounter for other specified antenatal screening: Secondary | ICD-10-CM

## 2016-02-07 DIAGNOSIS — O99820 Streptococcus B carrier state complicating pregnancy: Secondary | ICD-10-CM | POA: Diagnosis not present

## 2016-02-07 DIAGNOSIS — Z3483 Encounter for supervision of other normal pregnancy, third trimester: Secondary | ICD-10-CM

## 2016-02-07 NOTE — Progress Notes (Signed)
Subjective:    Angel NortonJazmin Huynh is a 20 y.o. female being seen today for her obstetrical visit. She is at 5770w3d gestation. Patient reports backache, no bleeding, occasional contractions and increased vaginal discharge. Fetal movement: normal.  Problem List Items Addressed This Visit      Other   GBS (group B Streptococcus carrier), +RV culture, currently pregnant - Primary    Other Visit Diagnoses    Encounter for supervision of other normal pregnancy in third trimester       NST (non-stress test) reactive         Patient Active Problem List   Diagnosis Date Noted  . GBS (group B Streptococcus carrier), +RV culture, currently pregnant 01/24/2016  . Supervision of normal pregnancy, antepartum 09/12/2015  . Late prenatal care affecting pregnancy, antepartum 09/12/2015    Objective:    BP 107/70   Pulse (!) 103   Wt 211 lb (95.7 kg)   LMP 04/27/2015   BMI 37.38 kg/m  FHT:  150 BPM  Uterine Size: 40 cm and size equals dates  Presentation: cephalic  Pelvic Exam:              Dilation: 2cm       Effacement: 50%   Station:  -3     Consistency: soft            Position: middle    NST: + accels, no decels, moderate variability, Cat. 1 tracing. No contractions on toco.    Assessment:    Pregnancy @ 5870w3d  weeks   TBP: tired of being pregnant  reactive NST  Plan:    Postdates management: discussed fetal surveillance and induction, discussed fetal movement, NST reactive, biophysical profile US reviewed Induction: scheduled for 02/11/16 @0730 , written information given.  Follow up in 4 weeks postpartum.

## 2016-02-07 NOTE — Progress Notes (Signed)
Pt states that she is having leaking. [facility] for [facility], neg fern test.  Pt had u/s yesterday, fluid normal. Pt scheduled for IOL Monday 02/11/16 at 0730.

## 2016-02-08 ENCOUNTER — Telehealth (HOSPITAL_COMMUNITY): Payer: Self-pay | Admitting: *Deleted

## 2016-02-08 NOTE — Telephone Encounter (Signed)
Preadmission screen  

## 2016-02-11 ENCOUNTER — Inpatient Hospital Stay (HOSPITAL_COMMUNITY): Payer: Medicaid Other | Admitting: Anesthesiology

## 2016-02-11 ENCOUNTER — Encounter (HOSPITAL_COMMUNITY): Payer: Self-pay

## 2016-02-11 ENCOUNTER — Inpatient Hospital Stay (HOSPITAL_COMMUNITY)
Admission: RE | Admit: 2016-02-11 | Discharge: 2016-02-12 | DRG: 775 | Disposition: A | Discharge status: Home / Self Care | Payer: Medicaid Other | Source: Ambulatory Visit | Attending: Family Medicine | Admitting: Family Medicine

## 2016-02-11 DIAGNOSIS — IMO0002 Reserved for concepts with insufficient information to code with codable children: Secondary | ICD-10-CM | POA: Diagnosis present

## 2016-02-11 DIAGNOSIS — O322XX0 Maternal care for transverse and oblique lie, not applicable or unspecified: Secondary | ICD-10-CM | POA: Diagnosis present

## 2016-02-11 DIAGNOSIS — O99820 Streptococcus B carrier state complicating pregnancy: Secondary | ICD-10-CM

## 2016-02-11 DIAGNOSIS — O480 Post-term pregnancy: Principal | ICD-10-CM | POA: Diagnosis present

## 2016-02-11 DIAGNOSIS — Z833 Family history of diabetes mellitus: Secondary | ICD-10-CM

## 2016-02-11 DIAGNOSIS — O99824 Streptococcus B carrier state complicating childbirth: Secondary | ICD-10-CM | POA: Diagnosis present

## 2016-02-11 DIAGNOSIS — Z3A41 41 weeks gestation of pregnancy: Secondary | ICD-10-CM | POA: Diagnosis not present

## 2016-02-11 LAB — TYPE AND SCREEN
ABO/RH(D): A POS
Antibody Screen: NEGATIVE

## 2016-02-11 LAB — COMPREHENSIVE METABOLIC PANEL
ALT: 7 U/L — AB (ref 14–54)
AST: 16 U/L (ref 15–41)
Albumin: 3 g/dL — ABNORMAL LOW (ref 3.5–5.0)
Alkaline Phosphatase: 161 U/L — ABNORMAL HIGH (ref 38–126)
Anion gap: 8 (ref 5–15)
BUN: 8 mg/dL (ref 6–20)
CALCIUM: 9.5 mg/dL (ref 8.9–10.3)
CHLORIDE: 107 mmol/L (ref 101–111)
CO2: 21 mmol/L — ABNORMAL LOW (ref 22–32)
CREATININE: 0.55 mg/dL (ref 0.44–1.00)
Glucose, Bld: 76 mg/dL (ref 65–99)
Potassium: 4 mmol/L (ref 3.5–5.1)
Sodium: 136 mmol/L (ref 135–145)
Total Bilirubin: 0.2 mg/dL — ABNORMAL LOW (ref 0.3–1.2)
Total Protein: 6.5 g/dL (ref 6.5–8.1)

## 2016-02-11 LAB — CBC
HCT: 32.8 % — ABNORMAL LOW (ref 36.0–46.0)
Hemoglobin: 10.2 g/dL — ABNORMAL LOW (ref 12.0–15.0)
MCH: 22.7 pg — AB (ref 26.0–34.0)
MCHC: 31.1 g/dL (ref 30.0–36.0)
MCV: 73.1 fL — AB (ref 78.0–100.0)
PLATELETS: 222 10*3/uL (ref 150–400)
RBC: 4.49 MIL/uL (ref 3.87–5.11)
RDW: 16.3 % — ABNORMAL HIGH (ref 11.5–15.5)
WBC: 9.3 10*3/uL (ref 4.0–10.5)

## 2016-02-11 LAB — URINALYSIS, ROUTINE W REFLEX MICROSCOPIC
BILIRUBIN URINE: NEGATIVE
GLUCOSE, UA: NEGATIVE mg/dL
Ketones, ur: NEGATIVE mg/dL
Nitrite: NEGATIVE
PH: 6 (ref 5.0–8.0)
Protein, ur: NEGATIVE mg/dL
SPECIFIC GRAVITY, URINE: 1.01 (ref 1.005–1.030)

## 2016-02-11 LAB — RAPID URINE DRUG SCREEN, HOSP PERFORMED
Amphetamines: NOT DETECTED
BARBITURATES: NOT DETECTED
Benzodiazepines: NOT DETECTED
Cocaine: NOT DETECTED
Opiates: NOT DETECTED
TETRAHYDROCANNABINOL: NOT DETECTED

## 2016-02-11 LAB — URINE MICROSCOPIC-ADD ON
Bacteria, UA: NONE SEEN
RBC / HPF: NONE SEEN RBC/hpf (ref 0–5)

## 2016-02-11 LAB — RPR: RPR: NONREACTIVE

## 2016-02-11 LAB — ABO/RH: ABO/RH(D): A POS

## 2016-02-11 MED ORDER — WITCH HAZEL-GLYCERIN EX PADS: 1.0000 "application " | MEDICATED_PAD | CUTANEOUS | Status: DC | PRN

## 2016-02-11 MED ORDER — ACETAMINOPHEN 325 MG PO TABS
650.0000 mg | ORAL_TABLET | ORAL | Status: DC | PRN
  Filled 2016-02-11: qty 2

## 2016-02-11 MED ORDER — METHYLERGONOVINE MALEATE 0.2 MG/ML IJ SOLN
INTRAMUSCULAR | Status: AC
  Filled 2016-02-11: qty 1

## 2016-02-11 MED ORDER — LACTATED RINGERS IV SOLN
INTRAVENOUS | Status: DC
  Administered 2016-02-11 (×2): via INTRAVENOUS

## 2016-02-11 MED ORDER — BENZOCAINE-MENTHOL 20-0.5 % EX AERO: 1.0000 "application " | INHALATION_SPRAY | CUTANEOUS | Status: DC | PRN

## 2016-02-11 MED ORDER — LIDOCAINE HCL (PF) 1 % IJ SOLN
30.0000 mL | INTRAMUSCULAR | Status: DC | PRN
  Filled 2016-02-11: qty 30

## 2016-02-11 MED ORDER — DIPHENHYDRAMINE HCL 50 MG/ML IJ SOLN: 12.5000 mg | INTRAMUSCULAR | Status: DC | PRN

## 2016-02-11 MED ORDER — OXYCODONE HCL 5 MG PO TABS: 5.0000 mg | ORAL_TABLET | ORAL | Status: DC | PRN

## 2016-02-11 MED ORDER — METHYLERGONOVINE MALEATE 0.2 MG/ML IJ SOLN: 0.2000 mg | INTRAMUSCULAR | Status: DC | PRN

## 2016-02-11 MED ORDER — DIPHENHYDRAMINE HCL 25 MG PO CAPS: 25.0000 mg | ORAL_CAPSULE | Freq: Four times a day (QID) | ORAL | Status: DC | PRN

## 2016-02-11 MED ORDER — OXYTOCIN BOLUS FROM INFUSION
500.0000 mL | Freq: Once | INTRAVENOUS | Status: AC
  Administered 2016-02-11: 500 mL via INTRAVENOUS

## 2016-02-11 MED ORDER — SENNOSIDES-DOCUSATE SODIUM 8.6-50 MG PO TABS: 2.0000 | ORAL_TABLET | ORAL | Status: DC

## 2016-02-11 MED ORDER — MISOPROSTOL 25 MCG QUARTER TABLET
25.0000 ug | ORAL_TABLET | ORAL | Status: DC | PRN
  Administered 2016-02-11: 25 ug via VAGINAL
  Filled 2016-02-11: qty 0.25
  Filled 2016-02-11: qty 1

## 2016-02-11 MED ORDER — IBUPROFEN 600 MG PO TABS
600.0000 mg | ORAL_TABLET | Freq: Four times a day (QID) | ORAL | Status: DC
  Administered 2016-02-11: 600 mg via ORAL
  Filled 2016-02-11: qty 1

## 2016-02-11 MED ORDER — EPHEDRINE 5 MG/ML INJ
10.0000 mg | INTRAVENOUS | Status: DC | PRN
  Filled 2016-02-11: qty 4

## 2016-02-11 MED ORDER — METHYLERGONOVINE MALEATE 0.2 MG PO TABS: 0.2000 mg | ORAL_TABLET | ORAL | Status: DC | PRN

## 2016-02-11 MED ORDER — TETANUS-DIPHTH-ACELL PERTUSSIS 5-2.5-18.5 LF-MCG/0.5 IM SUSP: 0.5000 mL | Freq: Once | INTRAMUSCULAR | Status: DC

## 2016-02-11 MED ORDER — PHENYLEPHRINE 40 MCG/ML (10ML) SYRINGE FOR IV PUSH (FOR BLOOD PRESSURE SUPPORT)
80.0000 ug | PREFILLED_SYRINGE | INTRAVENOUS | Status: DC | PRN
  Filled 2016-02-11: qty 5
  Filled 2016-02-11: qty 10

## 2016-02-11 MED ORDER — OXYTOCIN 40 UNITS IN LACTATED RINGERS INFUSION - SIMPLE MED: 2.5000 [IU]/h | INTRAVENOUS | Status: DC | PRN

## 2016-02-11 MED ORDER — SIMETHICONE 80 MG PO CHEW: 80.0000 mg | CHEWABLE_TABLET | ORAL | Status: DC | PRN

## 2016-02-11 MED ORDER — OXYCODONE-ACETAMINOPHEN 5-325 MG PO TABS: 1.0000 | ORAL_TABLET | ORAL | Status: DC | PRN

## 2016-02-11 MED ORDER — FLEET ENEMA 7-19 GM/118ML RE ENEM: 1.0000 | ENEMA | RECTAL | Status: DC | PRN

## 2016-02-11 MED ORDER — OXYCODONE HCL 5 MG PO TABS: 10.0000 mg | ORAL_TABLET | ORAL | Status: DC | PRN

## 2016-02-11 MED ORDER — PENICILLIN G POT IN DEXTROSE 60000 UNIT/ML IV SOLN
3.0000 10*6.[IU] | INTRAVENOUS | Status: DC
  Administered 2016-02-11: 3 10*6.[IU] via INTRAVENOUS
  Filled 2016-02-11 (×4): qty 50

## 2016-02-11 MED ORDER — MEASLES, MUMPS & RUBELLA VAC ~~LOC~~ INJ: 0.5000 mL | INJECTION | Freq: Once | SUBCUTANEOUS | Status: DC

## 2016-02-11 MED ORDER — COCONUT OIL OIL: 1.0000 "application " | TOPICAL_OIL | Status: DC | PRN

## 2016-02-11 MED ORDER — LIDOCAINE HCL (PF) 1 % IJ SOLN
INTRAMUSCULAR | Status: DC | PRN
  Administered 2016-02-11 (×2): 7 mL via EPIDURAL

## 2016-02-11 MED ORDER — FENTANYL 2.5 MCG/ML BUPIVACAINE 1/10 % EPIDURAL INFUSION (WH - ANES)
14.0000 mL/h | INTRAMUSCULAR | Status: DC | PRN
  Filled 2016-02-11: qty 100

## 2016-02-11 MED ORDER — ACETAMINOPHEN 325 MG PO TABS: 650.0000 mg | ORAL_TABLET | ORAL | Status: DC | PRN

## 2016-02-11 MED ORDER — TERBUTALINE SULFATE 1 MG/ML IJ SOLN
0.2500 mg | Freq: Once | INTRAMUSCULAR | Status: DC | PRN
  Filled 2016-02-11: qty 1

## 2016-02-11 MED ORDER — ONDANSETRON HCL 4 MG PO TABS: 4.0000 mg | ORAL_TABLET | ORAL | Status: DC | PRN

## 2016-02-11 MED ORDER — LACTATED RINGERS IV SOLN: 500.0000 mL | INTRAVENOUS | Status: DC | PRN

## 2016-02-11 MED ORDER — FENTANYL CITRATE (PF) 100 MCG/2ML IJ SOLN: 100.0000 ug | INTRAMUSCULAR | Status: DC | PRN

## 2016-02-11 MED ORDER — LACTATED RINGERS IV SOLN: 500.0000 mL | Freq: Once | INTRAVENOUS | Status: DC

## 2016-02-11 MED ORDER — OXYCODONE-ACETAMINOPHEN 5-325 MG PO TABS: 2.0000 | ORAL_TABLET | ORAL | Status: DC | PRN

## 2016-02-11 MED ORDER — MISOPROSTOL 200 MCG PO TABS
ORAL_TABLET | ORAL | Status: AC
  Filled 2016-02-11: qty 4

## 2016-02-11 MED ORDER — PRENATAL MULTIVITAMIN CH: 1.0000 | ORAL_TABLET | Freq: Every day | ORAL | Status: DC

## 2016-02-11 MED ORDER — OXYTOCIN 40 UNITS IN LACTATED RINGERS INFUSION - SIMPLE MED
2.5000 [IU]/h | INTRAVENOUS | Status: DC
  Filled 2016-02-11: qty 1000

## 2016-02-11 MED ORDER — IBUPROFEN 100 MG/5ML PO SUSP
600.0000 mg | Freq: Four times a day (QID) | ORAL | Status: DC
  Administered 2016-02-11 – 2016-02-12 (×2): 600 mg via ORAL
  Filled 2016-02-11 (×9): qty 30

## 2016-02-11 MED ORDER — SOD CITRATE-CITRIC ACID 500-334 MG/5ML PO SOLN: 30.0000 mL | ORAL | Status: DC | PRN

## 2016-02-11 MED ORDER — ONDANSETRON HCL 4 MG/2ML IJ SOLN: 4.0000 mg | INTRAMUSCULAR | Status: DC | PRN

## 2016-02-11 MED ORDER — PHENYLEPHRINE 40 MCG/ML (10ML) SYRINGE FOR IV PUSH (FOR BLOOD PRESSURE SUPPORT)
80.0000 ug | PREFILLED_SYRINGE | INTRAVENOUS | Status: DC | PRN
  Filled 2016-02-11: qty 5

## 2016-02-11 MED ORDER — LIDOCAINE-EPINEPHRINE (PF) 2 %-1:200000 IJ SOLN
INTRAMUSCULAR | Status: DC | PRN
  Administered 2016-02-11: 14 mL via EPIDURAL

## 2016-02-11 MED ORDER — PENICILLIN G POTASSIUM 5000000 UNITS IJ SOLR
5.0000 10*6.[IU] | Freq: Once | INTRAVENOUS | Status: AC
  Administered 2016-02-11: 5 10*6.[IU] via INTRAVENOUS
  Filled 2016-02-11: qty 5

## 2016-02-11 MED ORDER — OXYTOCIN 10 UNIT/ML IJ SOLN: 10.0000 [IU] | Freq: Once | INTRAMUSCULAR | Status: DC

## 2016-02-11 MED ORDER — ONDANSETRON HCL 4 MG/2ML IJ SOLN: 4.0000 mg | Freq: Four times a day (QID) | INTRAMUSCULAR | Status: DC | PRN

## 2016-02-11 MED ORDER — MISOPROSTOL 200 MCG PO TABS: 800.0000 ug | ORAL_TABLET | Freq: Once | ORAL | Status: DC

## 2016-02-11 MED ORDER — DIBUCAINE 1 % RE OINT: 1.0000 "application " | TOPICAL_OINTMENT | RECTAL | Status: DC | PRN

## 2016-02-11 NOTE — H&P (Signed)
LABOR AND DELIVERY ADMISSION HISTORY AND PHYSICAL NOTE  Angel Huynh is a 20 y.o. female G2P1001 with IUP at 4379w0d by US presenting for post-dates IOl.   She reports positive fetal movement. She denies leakage of fluid or vaginal bleeding. Denies contractions.   Prenatal History/Complications:  Past Medical History: Past Medical History:  Diagnosis Date  . Medical history non-contributory     Past Surgical History: Past Surgical History:  Procedure Laterality Date  . NO PAST SURGERIES      Obstetrical History: OB History    Gravida Para Term Preterm AB Living   2 1 1     1    SAB TAB Ectopic Multiple Live Births           1      Social History: Social History   Social History  . Marital status: Single    Spouse name: N/A  . Number of children: N/A  . Years of education: N/A   Social History Main Topics  . Smoking status: Never Smoker  . Smokeless tobacco: Never Used  . Alcohol use No  . Drug use: No  . Sexual activity: Yes   Other Topics Concern  . None   Social History Narrative  . None    Family History: Family History  Problem Relation Age of Onset  . Diabetes Father     Allergies: No Known Allergies  Prescriptions Prior to Admission  Medication Sig Dispense Refill Last Dose  . calcium carbonate (TUMS - DOSED IN MG ELEMENTAL CALCIUM) 500 MG chewable tablet Chew 1 tablet by mouth as needed for indigestion or heartburn.   02/11/2016 at Unknown time  . Prenatal Vit-Fe Phos-FA-Omega (VITAFOL GUMMIES) 3.33-0.333-34.8 MG CHEW Chew 3 each by mouth daily.   Past Week at Unknown time     Review of Systems   All systems reviewed and negative except as stated in HPI  Blood pressure 129/83, pulse 88, temperature 98.5 F (36.9 C), temperature source Oral, resp. rate 18, height 5\' 3"  (1.6 m), weight 94.8 kg (209 lb), last menstrual period 04/27/2015. General appearance: alert, cooperative, appears stated age, no distress and moderately obese Lungs:  clear to auscultation bilaterally Heart: regular rate and rhythm Abdomen: soft, non-tender; bowel sounds normal Extremities: No calf swelling or tenderness Presentation: cephalic Fetal monitoring: 120 FHR with mod variability and +accels Uterine activity: Normal Dilation: 2.5 Effacement (%): Thick Station: -3 Exam by:: J.Cox, RN   Prenatal labs: ABO, Rh: --/--/A POS (11/13 0750) Antibody: PENDING (11/13 0750) negative 06/14 Rubella: !Error! 1.47  RPR: Non Reactive (08/09 1045)  HBsAg: Negative (06/14 1710)  HIV: Non Reactive (08/09 1045)  GBS: Positive (10/10 1502)  1 hr Glucola: 3rd trim GTT 70/115/96 Genetic screening:  NIPS normal Anatomy US: female  Prenatal Transfer Tool  Maternal Diabetes: No Genetic Screening: Normal Maternal Ultrasounds/Referrals: Normal Fetal Ultrasounds or other Referrals:  None Maternal Substance Abuse:  No Significant Maternal Medications:  None Significant Maternal Lab Results: Lab values include: Group B Strep positive  Results for orders placed or performed during the hospital encounter of 02/11/16 (from the past 24 hour(s))  CBC   Collection Time: 02/11/16  7:50 AM  Result Value Ref Range   WBC 9.3 4.0 - 10.5 K/uL   RBC 4.49 3.87 - 5.11 MIL/uL   Hemoglobin 10.2 (L) 12.0 - 15.0 g/dL   HCT 16.132.8 (L) 09.636.0 - 04.546.0 %   MCV 73.1 (L) 78.0 - 100.0 fL   MCH 22.7 (L) 26.0 - 34.0 pg  MCHC 31.1 30.0 - 36.0 g/dL   RDW 16.116.3 (H) 09.611.5 - 04.515.5 %   Platelets 222 150 - 400 K/uL  Comprehensive metabolic panel   Collection Time: 02/11/16  7:50 AM  Result Value Ref Range   Sodium 136 135 - 145 mmol/L   Potassium 4.0 3.5 - 5.1 mmol/L   Chloride 107 101 - 111 mmol/L   CO2 21 (L) 22 - 32 mmol/L   Glucose, Bld 76 65 - 99 mg/dL   BUN 8 6 - 20 mg/dL   Creatinine, Ser 4.090.55 0.44 - 1.00 mg/dL   Calcium 9.5 8.9 - 81.110.3 mg/dL   Total Protein 6.5 6.5 - 8.1 g/dL   Albumin 3.0 (L) 3.5 - 5.0 g/dL   AST 16 15 - 41 U/L   ALT 7 (L) 14 - 54 U/L   Alkaline Phosphatase  161 (H) 38 - 126 U/L   Total Bilirubin 0.2 (L) 0.3 - 1.2 mg/dL   GFR calc non Af Amer >60 >60 mL/min   GFR calc Af Amer >60 >60 mL/min   Anion gap 8 5 - 15  Type and screen   Collection Time: 02/11/16  7:50 AM  Result Value Ref Range   ABO/RH(D) A POS    Antibody Screen PENDING    Sample Expiration 02/14/2016     Patient Active Problem List   Diagnosis Date Noted  . Encounter for trial of labor 02/11/2016  . GBS (group B Streptococcus carrier), +RV culture, currently pregnant 01/24/2016  . Supervision of normal pregnancy, antepartum 09/12/2015  . Late prenatal care affecting pregnancy, antepartum 09/12/2015    Assessment: Angel Huynh is a 20 y.o. G2P1001 at 5635w0d here for IOL for post dates. Admit to L&D.   #labor: IOL for post-dates #Pain: Undecided - is considering IV pain meds vs NO vs epidural #FWB: Cat 1  #ID:  GBS + #MOF: Breast #MOC: Nexplanon #Circ:  Outpatient  Angel Huynh E Angel Huynh 02/11/2016, 9:04 AM

## 2016-02-11 NOTE — H&P (Signed)
LABOR AND DELIVERY ADMISSION HISTORY AND PHYSICAL NOTE  Angel Huynh is a 20 y.o. femMerita Nortonale G2P1001 with IUP at 3066w0d by LMP presenting for post-dates IOL.   She reports positive fetal movement. She denies leakage of fluid or vaginal bleeding. Denies contractions.   Prenatal History/Complications:  Past Medical History:     Past Medical History:  Diagnosis Date  . Medical history non-contributory     Past Surgical History:      Past Surgical History:  Procedure Laterality Date  . NO PAST SURGERIES      Obstetrical History:         OB History    Gravida Para Term Preterm AB Living   2 1 1     1    SAB TAB Ectopic Multiple Live Births           1      Social History: Social History        Social History  . Marital status: Single    Spouse name: N/A  . Number of children: N/A  . Years of education: N/A       Social History Main Topics  . Smoking status: Never Smoker  . Smokeless tobacco: Never Used  . Alcohol use No  . Drug use: No  . Sexual activity: Yes       Other Topics Concern  . None      Social History Narrative  . None    Family History:      Family History  Problem Relation Age of Onset  . Diabetes Father     Allergies: No Known Allergies         Prescriptions Prior to Admission  Medication Sig Dispense Refill Last Dose  . calcium carbonate (TUMS - DOSED IN MG ELEMENTAL CALCIUM) 500 MG chewable tablet Chew 1 tablet by mouth as needed for indigestion or heartburn.   02/11/2016 at Unknown time  . Prenatal Vit-Fe Phos-FA-Omega (VITAFOL GUMMIES) 3.33-0.333-34.8 MG CHEW Chew 3 each by mouth daily.   Past Week at Unknown time     Review of Systems   All systems reviewed and negative except as stated in HPI  Blood pressure 129/83, pulse 88, temperature 98.5 F (36.9 C), temperature source Oral, resp. rate 18, height 5\' 3"  (1.6 m), weight 94.8 kg (209 lb), last menstrual period 04/27/2015. General  appearance: alert, cooperative, appears stated age, no distress and moderately obese Lungs: clear to auscultation bilaterally Heart: regular rate and rhythm Abdomen: soft, non-tender; bowel sounds normal Extremities: No calf swelling or tenderness Presentation: cephalic Fetal monitoring: 120 FHR with mod variability and +accels Uterine activity: Normal Dilation: 2.5 Effacement (%): Thick Station: -3 Exam by:: J.Cox, RN   Prenatal labs: ABO, Rh: --/--/A POS (11/13 0750) Antibody: negative 06/14 Rubella: IMMUNE  RPR: Non Reactive (08/09 1045)  HBsAg: Negative (06/14 1710)  HIV: Non Reactive (08/09 1045)  GBS: Positive (10/10 1502)  1 hr Glucola: 3rd trim 3 hr GTT 70/115/96 Genetic screening:  NIPS normal Anatomy US: normal, female  Prenatal Transfer Tool  Maternal Diabetes: No Genetic Screening: Normal Maternal Ultrasounds/Referrals: Normal Fetal Ultrasounds or other Referrals:  None Maternal Substance Abuse:  No Significant Maternal Medications:  None Significant Maternal Lab Results: Lab values include: Group B Strep positive       Results for orders placed or performed during the hospital encounter of 02/11/16 (from the past 24 hour(s))  CBC   Collection Time: 02/11/16  7:50 AM  Result Value Ref Range   WBC  9.3 4.0 - 10.5 K/uL   RBC 4.49 3.87 - 5.11 MIL/uL   Hemoglobin 10.2 (L) 12.0 - 15.0 g/dL   HCT 47.832.8 (L) 29.536.0 - 62.146.0 %   MCV 73.1 (L) 78.0 - 100.0 fL   MCH 22.7 (L) 26.0 - 34.0 pg   MCHC 31.1 30.0 - 36.0 g/dL   RDW 30.816.3 (H) 65.711.5 - 84.615.5 %   Platelets 222 150 - 400 K/uL  Comprehensive metabolic panel   Collection Time: 02/11/16  7:50 AM  Result Value Ref Range   Sodium 136 135 - 145 mmol/L   Potassium 4.0 3.5 - 5.1 mmol/L   Chloride 107 101 - 111 mmol/L   CO2 21 (L) 22 - 32 mmol/L   Glucose, Bld 76 65 - 99 mg/dL   BUN 8 6 - 20 mg/dL   Creatinine, Ser 9.620.55 0.44 - 1.00 mg/dL   Calcium 9.5 8.9 - 95.210.3 mg/dL   Total Protein 6.5 6.5 - 8.1  g/dL   Albumin 3.0 (L) 3.5 - 5.0 g/dL   AST 16 15 - 41 U/L   ALT 7 (L) 14 - 54 U/L   Alkaline Phosphatase 161 (H) 38 - 126 U/L   Total Bilirubin 0.2 (L) 0.3 - 1.2 mg/dL   GFR calc non Af Amer >60 >60 mL/min   GFR calc Af Amer >60 >60 mL/min   Anion gap 8 5 - 15  Type and screen   Collection Time: 02/11/16  7:50 AM  Result Value Ref Range   ABO/RH(D) A POS    Antibody Screen PENDING    Sample Expiration 02/14/2016         Patient Active Problem List   Diagnosis Date Noted  . Encounter for trial of labor 02/11/2016  . GBS (group B Streptococcus carrier), +RV culture, currently pregnant 01/24/2016  . Supervision of normal pregnancy, antepartum 09/12/2015  . Late prenatal care affecting pregnancy, antepartum 09/12/2015    Assessment: Angel Huynh is a 20 y.o. G2P1001 at 4569w0d here for IOL for post dates. Admit to L&D.   #labor: IOL for post-dates #Pain:  Undecided - is considering IV pain meds vs NO vs epidural #FWB: Cat 1     #ID:      GBS +, plan PCN #MOF: Breast #MOC: Nexplanon #Circ:   Outpatient  Donette LarryMelanie Suzana Sohail, CNM  02/11/2016 10:41 AM

## 2016-02-11 NOTE — Anesthesia Pain Management Evaluation Note (Signed)
  CRNA Pain Management Visit Note  Patient: Merita NortonJazmin Huebert, 20 y.o., female  "Hello I am a member of the anesthesia team at [facility]. We have an anesthesia team available at all times to provide care throughout the hospital, including epidural management and anesthesia for C-section. I don't know your plan for the delivery whether it a natural birth, water birth, IV sedation, nitrous supplementation, doula or epidural, but we want to meet your pain goals."   1.Was your pain managed to your expectations on prior hospitalizations?   Yes   2.What is your expectation for pain management during this hospitalization?     Labor support without medications, Epidural, IV pain meds and Nitrous Oxide  3.How can we help you reach that goal? Had epidural with first pregnancy, but is trying to go natural this time. Was very willing to discuss all methods of pain control.  Record the patient's initial score and the patient's pain goal.   Pain: 1  Pain Goal: 8 [facility] wants you to be able to say your pain was always managed very well.  Rayah Fines 02/11/2016

## 2016-02-11 NOTE — Anesthesia Procedure Notes (Signed)
Epidural Patient location during procedure: OB Start time: 02/11/2016 12:24 PM End time: 02/11/2016 12:28 PM  Staffing Anesthesiologist: Leilani AbleHATCHETT, Thomson Herbers Performed: anesthesiologist   Preanesthetic Checklist Completed: patient identified, surgical consent, pre-op evaluation, timeout performed, IV checked, risks and benefits discussed and monitors and equipment checked  Epidural Patient position: sitting Prep: site prepped and draped and DuraPrep Patient monitoring: continuous pulse ox and blood pressure Approach: midline Location: L3-L4 Injection technique: LOR air  Needle:  Needle type: Tuohy  Needle gauge: 17 G Needle length: 9 cm and 9 Needle insertion depth: 6 cm Catheter type: closed end flexible Catheter size: 19 Gauge Catheter at skin depth: 11 cm Test dose: negative and Other  Assessment Sensory level: T9 Events: blood not aspirated, injection not painful, no injection resistance, negative IV test and no paresthesia  Additional Notes Reason for block:procedure for pain

## 2016-02-11 NOTE — Anesthesia Preprocedure Evaluation (Signed)

## 2016-02-12 MED ORDER — OXYCODONE HCL 5 MG PO TABS: 5.0000 mg | ORAL_TABLET | ORAL | 0 refills | Status: DC | PRN

## 2016-02-12 MED ORDER — IBUPROFEN 100 MG/5ML PO SUSP: 600.0000 mg | Freq: Four times a day (QID) | ORAL | 2 refills | Status: DC

## 2016-02-12 MED ORDER — FUSION PLUS PO CAPS: 1.0000 | ORAL_CAPSULE | Freq: Every day | ORAL | 2 refills | Status: DC

## 2016-02-12 MED ORDER — SENNOSIDES-DOCUSATE SODIUM 8.6-50 MG PO TABS: 2.0000 | ORAL_TABLET | ORAL | 2 refills | Status: DC

## 2016-02-12 NOTE — Progress Notes (Signed)
UR chart review completed.  

## 2016-02-12 NOTE — Discharge Summary (Signed)
Obstetric Discharge Summary Reason for Admission: induction of labor and for postdates @41weeks  Prenatal Procedures: NST and ultrasound Intrapartum Procedures: spontaneous vaginal delivery Postpartum Procedures: none Complications-Operative and Postpartum: labial laceration Hemoglobin  Date Value Ref Range Status  02/11/2016 10.2 (L) 12.0 - 15.0 g/dL Final   HCT  Date Value Ref Range Status  02/11/2016 32.8 (L) 36.0 - 46.0 % Final   Hematocrit  Date Value Ref Range Status  11/07/2015 33.6 (L) 34.0 - 46.6 % Final    Physical Exam:  General: alert, cooperative and no distress Lochia: appropriate Uterine Fundus: firm Incision: no significant drainage, no dehiscence, no significant erythema DVT Evaluation: No evidence of DVT seen on physical exam. No cords or calf tenderness. No significant calf/ankle edema.  Discharge Diagnoses: Post-date pregnancy, delivered NSVD  Discharge Information: Date: 02/12/2016 Activity: pelvic rest Diet: routine Medications: PNV, Ibuprofen, Colace, Iron and Percocet Condition: stable Instructions: refer to practice specific booklet Discharge to: home Follow-up Information    Angel Huynh, CNM Follow up in 4 week(s).   Specialty:  Certified Nurse Midwife Contact information: [address] [place] [id number] [phone]           Newborn Data: Live born female  Birth Weight: 8 lb 9.4 oz (3895 g) APGAR: 8, 9  Home with mother.  Angel Huynh, CNM 02/12/2016, 7:39 AM

## 2016-02-12 NOTE — Lactation Note (Signed)
This note was copied from a baby's chart. Lactation Consultation Note  Patient Name: Boy Enslee Chancy JYNWG'NToday's Date: 02/12/2016 Reason foMerita Nortonr consult: Initial assessment   Initial consult with mom of 20 hour old infant. Infant with 9 BF for 15-30 minutes, 3 bottle feeds of formula of 5-15 cc, 2 voids and 3 stools since birth. Infant weight 8 lb 5.7 oz with weight loss of 3% since birth. LATCH Scores 7-10 by Bedside RN's. Mom reports she had a difficult time BF her older child.   Mom reports she started formula since infant was cluster feeding and she needed to get some sleep. She reports she was so sleepy she could not even hold the infant and dad assister her with feeding last night. Mom reports she is aware to offer breast first and then follow with formula. She reports she is aware that the more she BF the sooner her milk will come in. Mom reports she is aware of how to hand express, enc her to hand express at beginning of feeding and after BF to apply to nipples. Mom reports she is having nipple tenderness with latch that improves with feeding.   Mom with compressible breasts and areola with small everted nipples. Mom was holding infant in her lap to latch him without pillow support. He was cueing to feed and mom latching him in the cradle hold. Assisted mom with pillow support, STS and aligning infant at the breast. Enc her to use cross cradle hold to latch infant. Infant had recently fed from the breast and the bottle and held nipple in his mouth. Enc mom to compress/massage breast with feeding to maximize milk transfer.   LC Brochure and BF Resources Handout given, mom reports [facility]. Mom informed of IP/OP Services,, BF Support Groups and LC phone #. Enc mom to call out to desk for feeding assistance as needed. Follow up tomorrow and prn.    Maternal Data Formula Feeding for Exclusion: No Has patient been taught Hand Expression?: Yes Does the patient have  breastfeeding experience prior to this delivery?: Yes  Feeding Feeding Type: Breast Fed Length of feed: 0 min  LATCH Score/Interventions Latch: Repeated attempts needed to sustain latch, nipple held in mouth throughout feeding, stimulation needed to elicit sucking reflex. Intervention(s): Adjust position;Assist with latch;Breast massage;Breast compression  Audible Swallowing: None  Type of Nipple: Everted at rest and after stimulation  Comfort (Breast/Nipple): Filling, red/small blisters or bruises, mild/mod discomfort  Problem noted: Mild/Moderate discomfort Interventions (Mild/moderate discomfort):  (EBM to nipples post BF, positioning and deeper latch)  Hold (Positioning): Assistance needed to correctly position infant at breast and maintain latch. Intervention(s): Breastfeeding basics reviewed;Support Pillows;Position options;Skin to skin  LATCH Score: 5  Lactation Tools Discussed/Used WIC Program: No (Plans to apply)   Consult Status Consult Status: Follow-up Date: 02/13/16 Follow-up type: In-patient    Silas FloodSharon S Chinaza Rooke 02/12/2016, 10:15 AM

## 2016-02-12 NOTE — Progress Notes (Signed)
Post Partum Day #1 Subjective: no complaints, up ad lib, voiding and tolerating PO  Objective: Blood pressure 127/72, pulse 77, temperature 98.5 F (36.9 C), temperature source Oral, resp. rate 18, height 5\' 3"  (1.6 m), weight 209 lb (94.8 kg), last menstrual period 04/27/2015, SpO2 100 %, unknown if currently breastfeeding.  Physical Exam:  General: alert, cooperative and no distress Lochia: appropriate Uterine Fundus: firm Incision: no significant drainage, no dehiscence, no significant erythema DVT Evaluation: No evidence of DVT seen on physical exam.   Recent Labs  02/11/16 0750  HGB 10.2*  HCT 32.8*    Assessment/Plan: Discharge home, Breastfeeding, Lactation consult and Contraception Nexplanon postpartum, out patient circ.   LOS: 1 day   Roe CoombsRachelle A Seraiah Nowack, CNM 02/12/2016, 7:33 AM

## 2016-02-12 NOTE — Anesthesia Postprocedure Evaluation (Signed)
Anesthesia Post Note  Patient: Angel NortonJazmin Huynh  Procedure(s) Performed: * No procedures listed *  Patient location during evaluation: Mother Baby Anesthesia Type: Epidural Level of consciousness: awake and alert Pain management: pain level controlled Vital Signs Assessment: post-procedure vital signs reviewed and stable Respiratory status: spontaneous breathing, nonlabored ventilation and respiratory function stable Cardiovascular status: stable Postop Assessment: no headache, no backache and epidural receding Anesthetic complications: no     Last Vitals:  Vitals:   02/11/16 2133 02/12/16 0525  BP: 121/69 127/72  Pulse: 84 77  Resp: 18 18  Temp: 37.1 C 36.9 C    Last Pain:  Vitals:   02/12/16 0525  TempSrc: Oral  PainSc: 0-No pain   Pain Goal:                 Angel Huynh,Angel Huynh

## 2016-03-18 ENCOUNTER — Ambulatory Visit (INDEPENDENT_AMBULATORY_CARE_PROVIDER_SITE_OTHER): Payer: Medicaid Other | Admitting: Obstetrics and Gynecology

## 2016-03-18 ENCOUNTER — Encounter: Payer: Self-pay | Admitting: Obstetrics and Gynecology

## 2016-03-18 VITALS — BP 108/74 | HR 98 | Ht 63.0 in | Wt 190.0 lb

## 2016-03-18 DIAGNOSIS — Z30430 Encounter for insertion of intrauterine contraceptive device: Secondary | ICD-10-CM | POA: Diagnosis not present

## 2016-03-18 DIAGNOSIS — Z30017 Encounter for initial prescription of implantable subdermal contraceptive: Secondary | ICD-10-CM

## 2016-03-18 DIAGNOSIS — Z3202 Encounter for pregnancy test, result negative: Secondary | ICD-10-CM | POA: Diagnosis not present

## 2016-03-18 DIAGNOSIS — Z7251 High risk heterosexual behavior: Secondary | ICD-10-CM

## 2016-03-18 LAB — POCT URINE PREGNANCY: Preg Test, Ur: NEGATIVE

## 2016-03-18 MED ORDER — ETONOGESTREL 68 MG ~~LOC~~ IMPL
68.0000 mg | DRUG_IMPLANT | Freq: Once | SUBCUTANEOUS | Status: AC
  Administered 2016-03-18: 68 mg via SUBCUTANEOUS

## 2016-03-18 NOTE — Patient Instructions (Signed)
Contraceptive Implant Information A contraceptive implant is a plastic rod that is inserted under your skin. It is usually inserted under the skin of your upper arm. It continually releases small amounts of progestin (synthetic progesterone) into your bloodstream. This prevents an egg from being released from your ovaries. It also thickens your cervical mucus to prevent sperm from entering the cervix, and it thins your uterine lining to prevent a fertilized egg from attaching to your uterus. Contraceptive implants can be effective for up to 3 years. They do not provide protection against sexually transmitted diseases (STDs).  The procedure to insert an implant usually takes about 10 minutes. There may be minor bruising, swelling, and discomfort at the insertion site for a couple days. The implant begins to work within the first day. Other contraceptive protection may be necessary for 7 days. Be sure to discuss with your health care provider if you need a backup method of contraception.  Your health care provider will make sure you are a good candidate for the contraceptive implant. Discuss with your health care provider the possible side effects of the implant. ADVANTAGES  It prevents pregnancy for up to 3 years.  It is easily reversible.  It is convenient.  It can be used when breastfeeding.  It can be used by women who cannot take estrogen. DISADVANTAGES  You may have irregular or unplanned vaginal bleeding.  You may develop side effects, including headache, weight gain, acne, breast tenderness, or mood changes.  You may have tissue or nerve damage after insertion (rare).  It may be difficult and uncomfortable to remove.  Certain medicines may interfere with the effectiveness of the implant. REMOVAL OF IMPLANT The implant should be removed in 3 years or as directed by your health care provider. The implant's effect wears off in a few hours after removal. Your ability to get pregnant  (fertility) may be restored in 1-2 weeks. A new implant can be inserted as soon as the old one is removed if desired. CONTRAINDICATIONS You should not get the implant if you are experiencing any of the following situations:  You are pregnant.  You have a history of breast cancer, osteoporosis, blood clots, heart disease, diabetes, high blood pressure, liver disease, tumors, or stroke.   You have undiagnosed vaginal bleeding.  You have a sensitivity to any part of the implant. This information is not intended to replace advice given to you by your health care provider. Make sure you discuss any questions you have with your health care provider. Document Released: 03/06/2011 Document Revised: 11/17/2012 Document Reviewed: 09/13/2012 Elsevier Interactive Patient Education  2017 Elsevier Inc.  

## 2016-03-18 NOTE — Progress Notes (Signed)
Post Partum Exam  Angel Huynh is a 20 y.o. 342P2002 female who presents for a postpartum visit. She is 5 weeks postpartum following a spontaneous vaginal delivery. I have fully reviewed the prenatal and intrapartum course. The delivery was at 41 gestational weeks.  Anesthesia: epidural. Postpartum course has been uncomplicated. Baby's course has been unremarkable. Baby is feeding by [place]. Bleeding no bleeding. Bowel function is normal. Bladder function is normal. Patient is sexually active. Contraception method is none. Postpartum depression screening:neg  The following portions of the patient's history were reviewed and updated as appropriate: allergies, current medications, past family history, past medical history, past social history and past surgical history.  Review of Systems Pertinent items are noted in HPI.    Objective:    BP 116/78 mmHg  Pulse 78  Resp 16  Ht 5\' 5"  (1.651 m)  Wt 211 lb (95.709 kg)  BMI 35.11 kg/m2  Breastfeeding? Yes  General:  alert   Breasts:  not examined  Lungs: clear to auscultation bilaterally  Heart:  regular rate and rhythm, S1, S2 normal, no murmur, click, rub or gallop  Abdomen: soft, non-tender; bowel sounds normal; no masses,  no organomegaly and not examined   Vulva:  not evaluated  Vagina: not evaluated  Cervix:  not examined  Corpus: not examined  Adnexa:  not evaluated  Rectal Exam: Not performed.        Angel Huynh is a 20 y.o. [id number] here for  Nexplanon insertion.    Nexplanon Insertion Procedure Patient identified, informed consent performed, consent signed.   Patient does understand that irregular bleeding is a very common side effect of this medication. She was advised to have backup contraception for one week after placement. Pregnancy test in clinic today was negative.  Appropriate time out taken.  Patient's left arm was prepped and draped in the usual sterile fashion.. The ruler used to measure and mark insertion  area.  Patient was prepped with alcohol swab and then injected with 3 ml of 1% lidocaine.  She was prepped with betadine, Nexplanon removed from packaging,  Device confirmed in needle, then inserted full length of needle and withdrawn per handbook instructions. Nexplanon was able to palpated in the patient's arm; patient palpated the insert herself. There was minimal blood loss.  Patient insertion site covered with guaze and a pressure bandage to reduce any bruising.  The patient tolerated the procedure well and was given post procedure instructions.   Hermina StaggersMichael L Rosali Augello, MD, FACOG Attending Obstetrician & Gynecologist [facility]     Assessment:    Normal postpartum exam.  Nexplanon insertion  Plan:   1. Contraception: Nexplanon 2. F/U in 4 weeks

## 2016-03-26 ENCOUNTER — Ambulatory Visit: Payer: Medicaid Other | Admitting: Obstetrics and Gynecology

## 2016-04-22 ENCOUNTER — Ambulatory Visit: Payer: Medicaid Other | Admitting: Obstetrics and Gynecology

## 2016-04-30 ENCOUNTER — Encounter: Payer: Self-pay | Admitting: Obstetrics and Gynecology

## 2017-09-23 IMAGING — US US MFM OB LIMITED
1 series · 12 of 24 positions shown · non-contrast
Comparison: none

[Series 1: us mfm ob limited · 24 acquisitions, 12 frames shown]
[im 2/24]
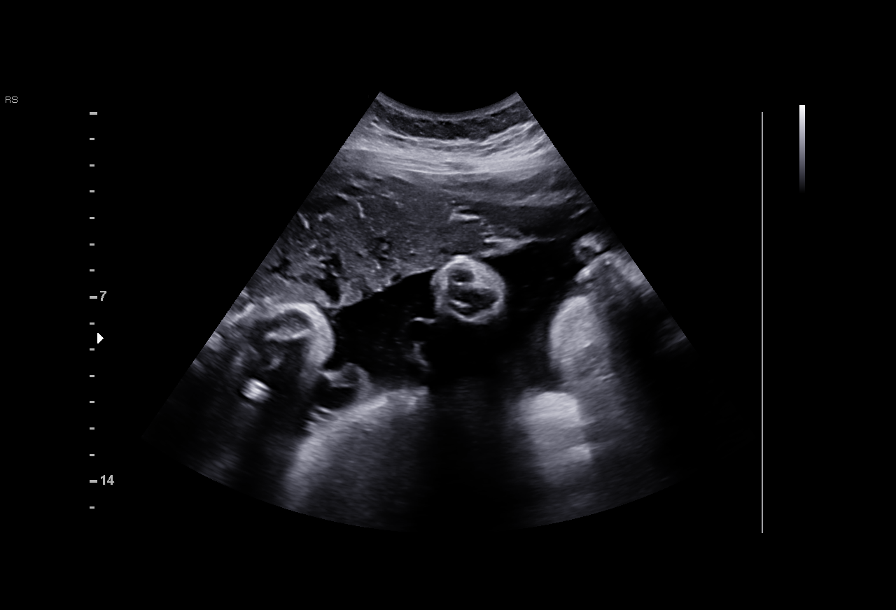
[im 4/24]
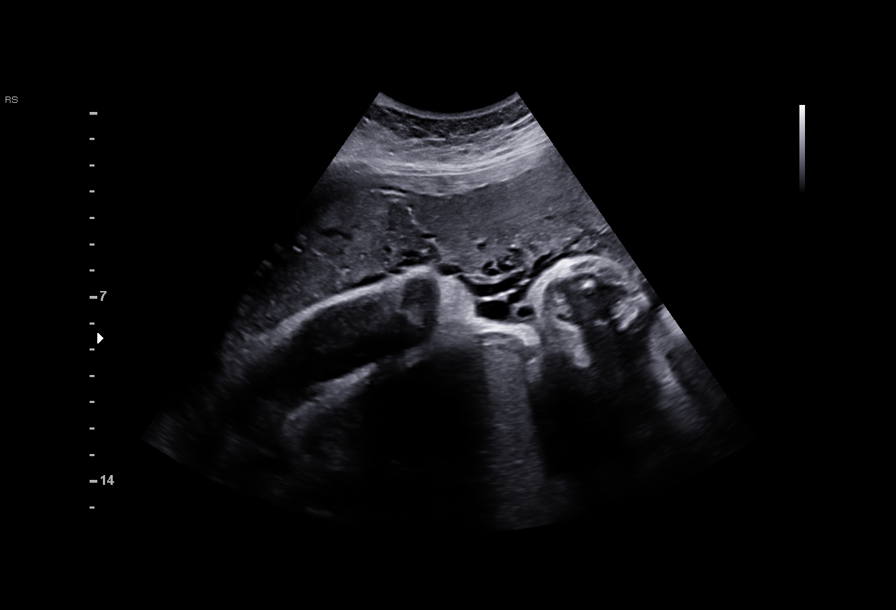
[im 6/24]
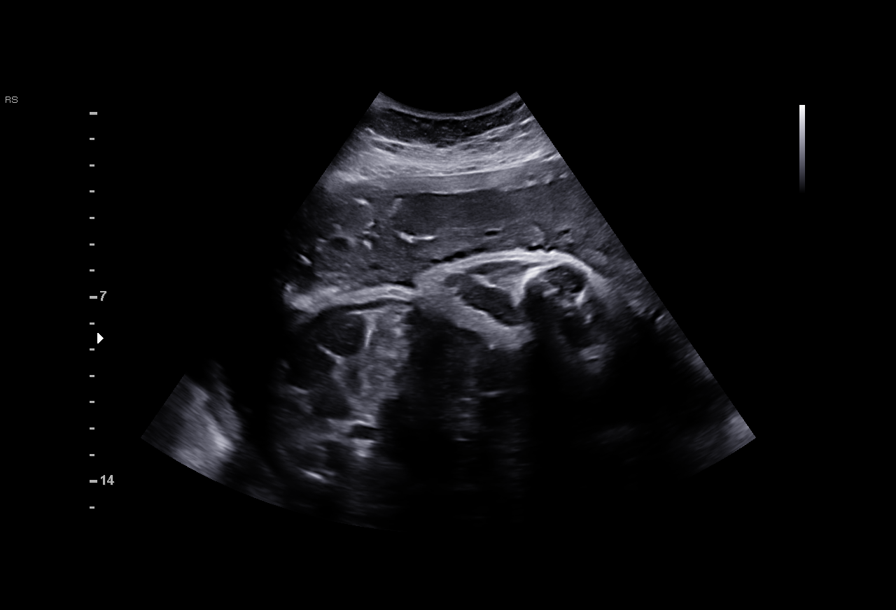
[im 8/24]
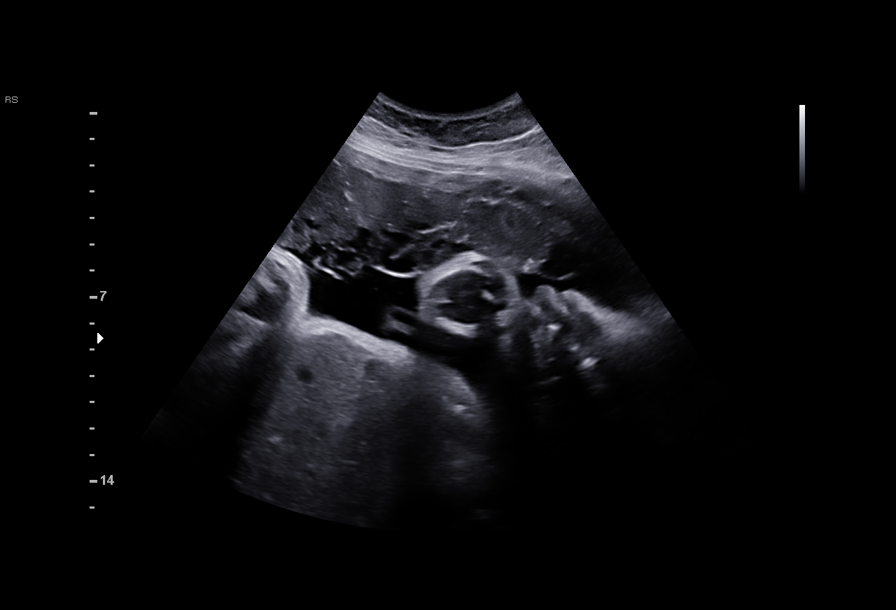
[im 10/24]
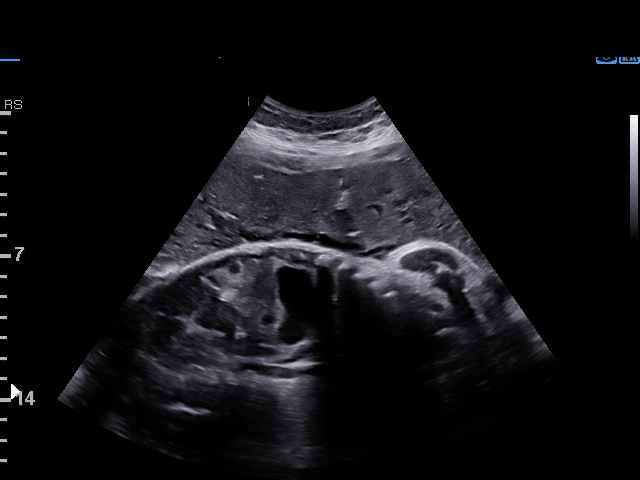
[im 12/24]
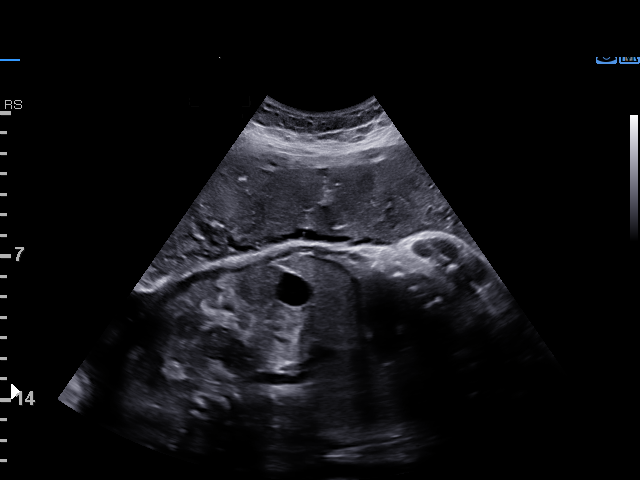
[im 14/24]
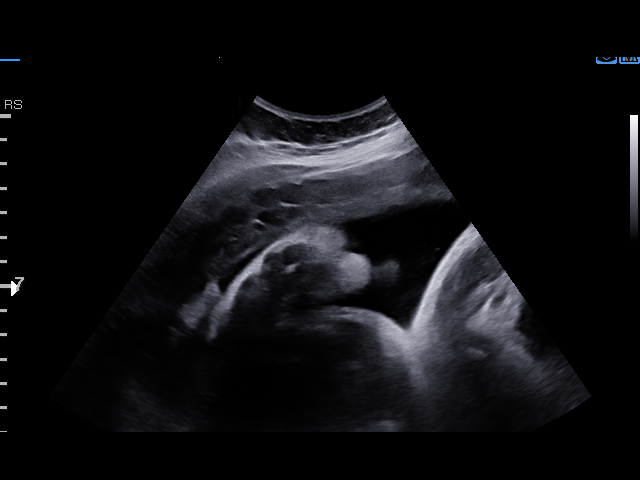
[im 16/24]
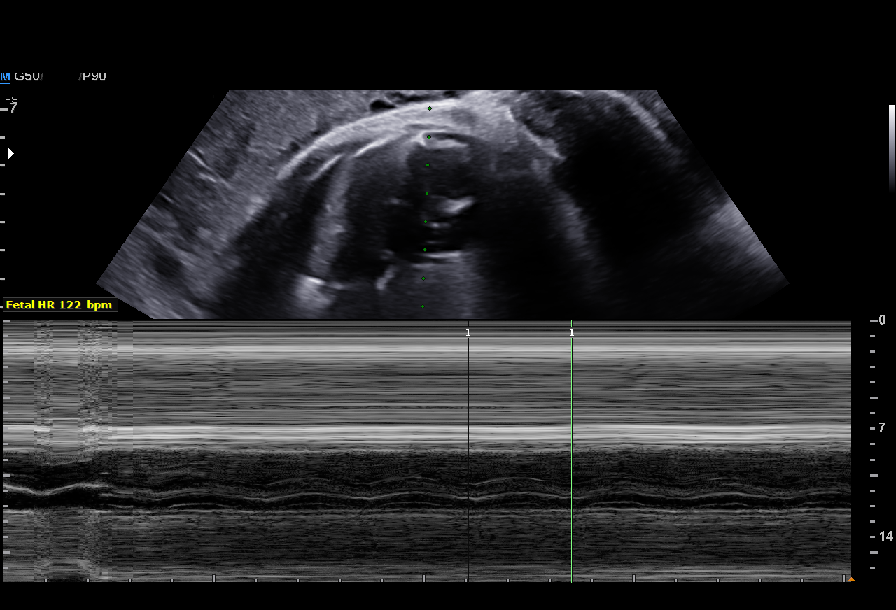
[im 18/24]
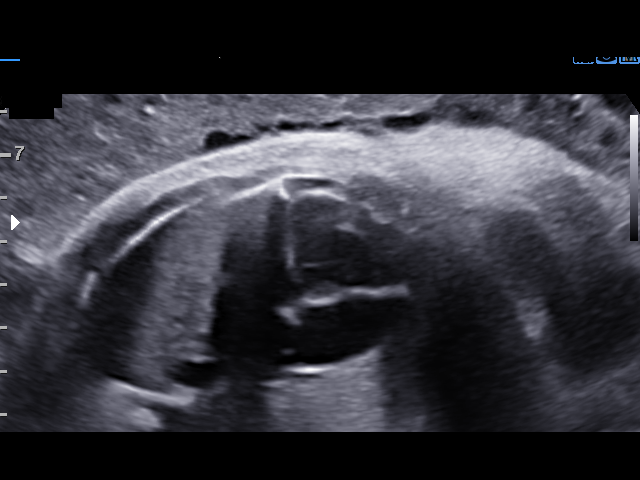
[im 20/24]
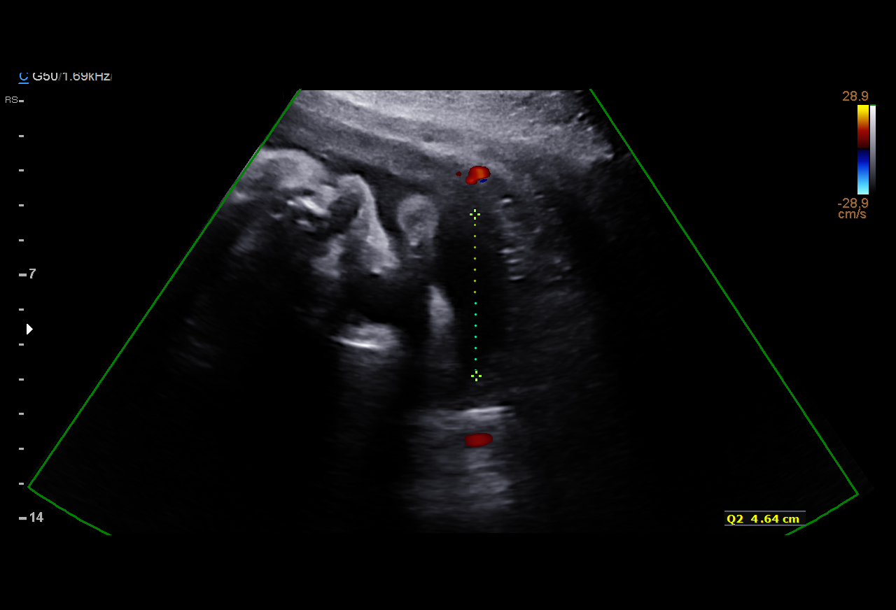
[im 22/24]
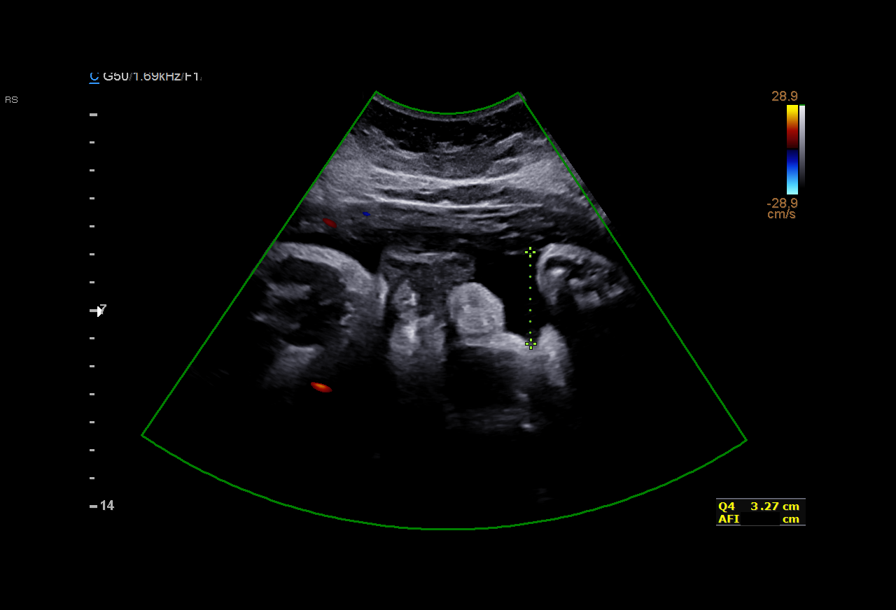
[im 24/24]
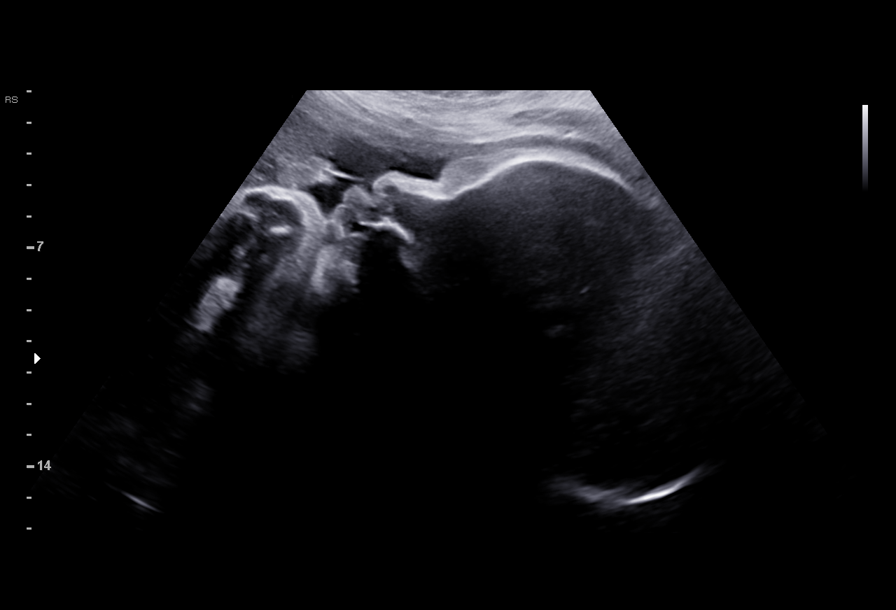

[12 of 24 positions shown; findings below may reference images not displayed]

1  SALAKIACO UAMI           [id number]      [id number]     [id number]
Indications

40 weeks gestation of pregnancy
Late to prenatal care, third trimester
Postdate pregnancy (40-42 weeks)
Fetal Evaluation

Num Of Fetuses:     1
Fetal Heart         122
Rate(bpm):
Cardiac Activity:   Observed
Presentation:       Cephalic
Placenta:           Anterior, above cervical os

Amniotic Fluid
AFI FV:      Subjectively within normal limits

AFI Sum(cm)     %Tile       Largest Pocket(cm)
16.57           73

RUQ(cm)       RLQ(cm)       LUQ(cm)        LLQ(cm)
3.77
Biophysical Evaluation

N.S.T:          Reactive
Gestational Age

Best:          40w 2d    Det. By:   Previous Ultrasound      EDD:   02/04/16
(09/18/15)
Cervix Uterus Adnexa

Cervix
Not visualized (advanced GA >80wks)

Uterus
No abnormality visualized.

Left Ovary
Not visualized.

Right Ovary
Not visualized.

Adnexa:       No abnormality visualized. No adnexal mass
visualized.
Impression

SIUP at 40+2 weeks
Cephalic presentation
Normal amniotic fluid volume
NST reactive
Recommendations

Continue twice weekly NSTs with weekly AFIs
